# Patient Record
Sex: Female | Born: 1965 | Hispanic: Refuse to answer | Marital: Single
Health system: Midwestern US, Community
[De-identification: ages and names within clinical notes are randomized; demographics above are authoritative.]

## PROBLEM LIST (undated history)

## (undated) DIAGNOSIS — F329 Major depressive disorder, single episode, unspecified: Secondary | ICD-10-CM

## (undated) DIAGNOSIS — F32A Depression, unspecified: Secondary | ICD-10-CM

## (undated) HISTORY — DX: Major depressive disorder, single episode, unspecified: F32.9

## (undated) HISTORY — DX: Depression, unspecified: F32.A

---

## 2001-11-10 ENCOUNTER — Emergency Department (HOSPITAL_COMMUNITY): Admission: EM | Admit: 2001-11-10 | Discharge: 2001-11-10 | Payer: Self-pay | Admitting: Emergency Medicine

## 2007-09-05 ENCOUNTER — Emergency Department (HOSPITAL_COMMUNITY): Admission: EM | Admit: 2007-09-05 | Discharge: 2007-09-05 | Payer: Self-pay | Admitting: Emergency Medicine

## 2007-09-24 ENCOUNTER — Encounter: Admission: RE | Admit: 2007-09-24 | Discharge: 2007-09-24 | Payer: Self-pay | Admitting: Nurse Practitioner

## 2010-01-29 ENCOUNTER — Emergency Department (HOSPITAL_COMMUNITY): Admission: EM | Admit: 2010-01-29 | Discharge: 2010-01-29 | Payer: Self-pay | Admitting: Emergency Medicine

## 2010-02-06 ENCOUNTER — Emergency Department (HOSPITAL_COMMUNITY): Admission: EM | Admit: 2010-02-06 | Discharge: 2010-02-06 | Payer: Self-pay | Admitting: Emergency Medicine

## 2010-07-13 LAB — URINALYSIS, ROUTINE W REFLEX MICROSCOPIC
Glucose, UA: NEGATIVE mg/dL
Ketones, ur: NEGATIVE mg/dL
Nitrite: NEGATIVE
Specific Gravity, Urine: 1.025 (ref 1.005–1.030)
pH: 6 (ref 5.0–8.0)

## 2010-07-13 LAB — GC/CHLAMYDIA PROBE AMP, GENITAL
Chlamydia, DNA Probe: NEGATIVE
GC Probe Amp, Genital: NEGATIVE

## 2010-07-13 LAB — WET PREP, GENITAL: Yeast Wet Prep HPF POC: NONE SEEN

## 2010-07-13 LAB — GLUCOSE, CAPILLARY: Glucose-Capillary: 80 mg/dL (ref 70–99)

## 2010-07-13 LAB — RPR: RPR Ser Ql: NONREACTIVE

## 2010-12-22 ENCOUNTER — Encounter: Payer: Self-pay | Admitting: *Deleted

## 2010-12-22 ENCOUNTER — Emergency Department (HOSPITAL_COMMUNITY)
Admission: EM | Admit: 2010-12-22 | Discharge: 2010-12-22 | Disposition: A | Discharge status: Home / Self Care | Payer: Self-pay | Attending: Emergency Medicine | Admitting: Emergency Medicine

## 2010-12-22 DIAGNOSIS — A599 Trichomoniasis, unspecified: Secondary | ICD-10-CM | POA: Insufficient documentation

## 2010-12-22 DIAGNOSIS — F17200 Nicotine dependence, unspecified, uncomplicated: Secondary | ICD-10-CM | POA: Insufficient documentation

## 2010-12-22 LAB — URINALYSIS, ROUTINE W REFLEX MICROSCOPIC
Bilirubin Urine: NEGATIVE
Glucose, UA: NEGATIVE mg/dL
Ketones, ur: NEGATIVE mg/dL
Nitrite: NEGATIVE
Specific Gravity, Urine: 1.03 (ref 1.005–1.030)
Urobilinogen, UA: 0.2 mg/dL (ref 0.0–1.0)
pH: 6 (ref 5.0–8.0)

## 2010-12-22 LAB — WET PREP, GENITAL
Clue Cells Wet Prep HPF POC: NONE SEEN
Yeast Wet Prep HPF POC: NONE SEEN

## 2010-12-22 LAB — URINE MICROSCOPIC-ADD ON

## 2010-12-22 LAB — POCT PREGNANCY, URINE: Preg Test, Ur: NEGATIVE

## 2010-12-22 MED ORDER — METRONIDAZOLE 500 MG PO TABS
2000.0000 mg | ORAL_TABLET | Freq: Once | ORAL | Status: AC
  Administered 2010-12-22: 2000 mg via ORAL
  Filled 2010-12-22 (×2): qty 4

## 2010-12-22 NOTE — ED Notes (Signed)
Resting quietly in bed. Denies pain or discomfort. NAD noted.

## 2010-12-22 NOTE — ED Provider Notes (Signed)
History     CSN: [id number] Arrival date & time: 12/22/2010 10:04 AM  Chief Complaint  Patient presents with  . Vaginal Discharge   HPI Comments: Patient c/o watery, thin vaginal discharge with itching for approx 3 weeks.  States she had unprotected sex with someone at onset of sx's.  She denies fever, vomiting, abd pain or urinary sx's.  Patient is a 45 y.o. female presenting with vaginal discharge. The history is provided by the patient.  Vaginal Discharge This is a new problem. The current episode started 1 to 4 weeks ago. The problem occurs constantly. The problem has been unchanged. Pertinent negatives include no abdominal pain, arthralgias, chills, fever, headaches, myalgias, neck pain, numbness, rash, swollen glands, urinary symptoms, vomiting or weakness. The symptoms are aggravated by nothing. She has tried nothing for the symptoms. The treatment provided no relief.    History reviewed. No pertinent past medical history.  Past Surgical History  Procedure Date  . Cesarean section     History reviewed. No pertinent family history.  History  Substance Use Topics  . Smoking status: Current Everyday Smoker -- 0.5 packs/day  . Smokeless tobacco: Not on file  . Alcohol Use: No    OB History    Grav Para Term Preterm Abortions TAB SAB Ect Mult Living                  Review of Systems  Constitutional: Negative for fever and chills.  HENT: Negative for neck pain.   Gastrointestinal: Negative for vomiting and abdominal pain.  Genitourinary: Positive for vaginal discharge. Negative for dysuria, urgency, vaginal bleeding, vaginal pain and menstrual problem.  Musculoskeletal: Negative for myalgias and arthralgias.  Skin: Negative for rash.  Neurological: Negative for weakness, numbness and headaches.  Hematological: Negative for adenopathy.  All other systems reviewed and are negative.    Physical Exam  BP 114/65  Pulse 79  Temp(Src) 97.8 F (36.6 C) (Oral)  Resp 16   Ht 5\' 4"  (1.626 m)  Wt 192 lb (87.091 kg)  BMI 32.96 kg/m2  SpO2 100%  Physical Exam  Nursing note and vitals reviewed. Constitutional: She is oriented to person, place, and time. She appears well-developed and well-nourished. No distress.  HENT:  Head: Normocephalic and atraumatic.  Mouth/Throat: Oropharynx is clear and moist.  Neck: Normal range of motion. Neck supple. No thyromegaly present.  Cardiovascular: Normal rate, regular rhythm and normal heart sounds.   Pulmonary/Chest: Effort normal and breath sounds normal.  Abdominal: Soft. She exhibits no distension and no mass. There is no tenderness. There is no rebound and no guarding.  Genitourinary: Uterus normal. There is no rash on the right labia. There is no rash on the left labia. Cervix exhibits discharge. Cervix exhibits no motion tenderness and no friability. Right adnexum displays no mass and no tenderness. Left adnexum displays no mass and no tenderness. No tenderness or bleeding around the vagina. No foreign body around the vagina. Vaginal discharge found.  Musculoskeletal: She exhibits no tenderness.  Lymphadenopathy:    She has no cervical adenopathy.  Neurological: She is alert and oriented to person, place, and time.  Skin: Skin is warm and dry.    ED Course  Procedures  MDM   Patient resting, NAD.  Non-toxic appearing.  I have reviewed nursing notes, vitals and lab results. Abd remains soft, NT.   I have treated pt for trichomonas today with flagyl.  Urine, GC, and chlaymdia cultures are pending.  She agrees to return  to ER if needed and to advise all sexual partners to be tested.    Patient / Family / Caregiver understand and agree with initial ED impression and plan with expectations set for ED visit.   Filed Vitals:   12/22/10 0958  BP: 114/65  Pulse: 79  Temp: 97.8 F (36.6 C)  Resp: 16       12/22/2010   Medications  Lactobacillus (ACIDOPHILUS) 10 MG CAPS (not administered)  Homeopathic  Products (AZO CONFIDENCE PO) (not administered)  metroNIDAZOLE (FLAGYL) tablet 2,000 mg (2000 mg Oral Given 12/22/10 1345)     The patient appears reasonably screened and/or stabilized for discharge and I doubt any other medical condition or other [facility] requiring further screening, evaluation, or treatment in the ED at this time prior to discharge.   Danna Sewell L. [place], [place] 12/28/10 [phone]

## 2010-12-22 NOTE — ED Notes (Signed)
Pt c/o clear watery discharge and vaginal itching x 3 weeks.

## 2010-12-23 LAB — GC/CHLAMYDIA PROBE AMP, GENITAL
Chlamydia, DNA Probe: NEGATIVE
GC Probe Amp, Genital: NEGATIVE

## 2011-01-03 NOTE — ED Provider Notes (Signed)
Medical screening examination/treatment/procedure(s) were conducted as a shared visit with non-physician practitioner(s) and myself.  I personally evaluated the patient during the encounter  Toy Baker, MD 01/03/11 2334

## 2011-03-16 ENCOUNTER — Emergency Department (HOSPITAL_COMMUNITY)
Admission: EM | Admit: 2011-03-16 | Discharge: 2011-03-16 | Disposition: A | Discharge status: Home / Self Care | Payer: Self-pay | Attending: Emergency Medicine | Admitting: Emergency Medicine

## 2011-03-16 ENCOUNTER — Encounter (HOSPITAL_COMMUNITY): Payer: Self-pay

## 2011-03-16 DIAGNOSIS — A599 Trichomoniasis, unspecified: Secondary | ICD-10-CM | POA: Insufficient documentation

## 2011-03-16 LAB — WET PREP, GENITAL
Clue Cells Wet Prep HPF POC: NONE SEEN
Yeast Wet Prep HPF POC: NONE SEEN

## 2011-03-16 MED ORDER — METRONIDAZOLE 500 MG PO TABS: 500.0000 mg | ORAL_TABLET | Freq: Two times a day (BID) | ORAL | Status: AC

## 2011-03-16 NOTE — ED Notes (Signed)
Pt presents with vaginal discharge. Pt states she thinks it is trichomonas. Pt was diagnosed with the same on 12/22/2010

## 2011-03-16 NOTE — ED Notes (Signed)
EDP in room with pt 

## 2011-03-16 NOTE — ED Provider Notes (Cosign Needed)
Scribed for Ward Givens, MD, the patient was seen in room APFT24/APFT24 . This chart was scribed by Ellie Lunch.   CSN: [id number] Arrival date & time: 03/16/2011  2:06 PM   First MD Initiated Contact with Patient 03/16/11 1458      Chief Complaint  Patient presents with  . Vaginal Discharge    (Consider location/radiation/quality/duration/timing/severity/associated sxs/prior treatment) HPI Pt seen at 3:14 PM  Rebecca Barron is a 45 y.o. female who presents to the Emergency Department complaining of vaginal discharge starting 2 days ago. Vaginal discharge is clear, thin, and odorous and associated with mild suprapubic pain. Pt denies  n/v, dysuria, difficulty urinating. Pt has h/o of trichomonas d/x 12/22/2010. Pt reports she had unprotected intercourse 4 days ago with the same partner who may still have trichomonas.   Pt does not have a current PCP.   History reviewed. No pertinent past medical history.  Postmenopausal last normal period 3 years ago  Past Surgical History  Procedure Date  . Cesarean section x 2      No family history on file.  History  Substance Use Topics  . Smoking status: Current Everyday Smoker -- 0.5 packs/day  . Smokeless tobacco: Not on file  . Alcohol Use: No  Not currently working.   Review of Systems  Gastrointestinal: Positive for abdominal pain (mild suprapubic pain).  Genitourinary: Positive for vaginal discharge. Negative for dysuria and difficulty urinating.  All other systems reviewed and are negative.    Allergies  Review of patient's allergies indicates no known allergies.  Home Medications   Current Outpatient Rx  Name Route Sig Dispense Refill  . ACIDOPHILUS 10 MG PO CAPS Oral Take 10 mg by mouth daily.      Marland Kitchen PHENTERMINE HCL 37.5 MG PO TABS Oral Take 37.5 mg by mouth daily before breakfast.      Phentermine started 5 days ago and Rx'd by Dr. Quintella Reichert, a bariatrics specialist.   BP 117/72  Pulse 87  Temp(Src) 97.6 F  (36.4 C) (Oral)  Resp 18  Ht 5\' 5"  (1.651 m)  Wt 185 lb (83.915 kg)  BMI 30.79 kg/m2  SpO2 99%   Vital signs normal  Physical Exam  Vitals reviewed. Constitutional: She appears well-developed and well-nourished.  HENT:  Head: Normocephalic and atraumatic.  Eyes: Conjunctivae and EOM are normal. Pupils are equal, round, and reactive to light.  Neck: Neck supple.  Pulmonary/Chest: Effort normal. Respiratory distress:  there is no guarding or rebound. Patient's noted to be poking her finger in the area stating "this is where it hurts"  Abdominal: Soft. Bowel sounds are normal. She exhibits no distension. There is tenderness. There is no rebound and no guarding.  Genitourinary:       Patient has normal external genitalia. She has thin white discharge noted. She has mild erythema of the cervix. Her uterus is nontender to palpation and she has no pain to movement of her cervix. Her left adnexa is nontender she has minimal discomfort of her right ovary which does not feel enlarged.  Musculoskeletal: Normal range of motion.       Pt has no acute abnormality.    Skin: Skin is warm and dry.  Psychiatric: She has a normal mood and affect. Her behavior is normal. Thought content normal.    ED Course  Procedures (including critical care time)No results found.     OTHER DATA REVIEWED: Nursing notes, vital signs, and past medical records reviewed.   DIAGNOSTIC STUDIES: Oxygen  Saturation is 99% on room air, normal by my interpretation.     Results for orders placed during the hospital encounter of 03/16/11  WET PREP, GENITAL      Component Value Range   Yeast, Wet Prep NONE SEEN  NONE SEEN    Trich, Wet Prep FEW (*) NONE SEEN    Clue Cells, Wet Prep NONE SEEN  NONE SEEN    WBC, Wet Prep HPF POC MANY (*) NONE SEEN    Laboratory interpretation trichomoniasis   ED DISCHARGE MEDICATIONS New Prescriptions   METRONIDAZOLE (FLAGYL) 500 MG TABLET    Take 1 tablet (500 mg total) by  mouth 2 (two) times daily.     1. Trichomonas       MDM    I personally performed the services described in this documentation, which was scribed in my presence. The recorded information has been reviewed and considered.        Ward Givens, MD 03/17/11 0130

## 2011-03-17 LAB — GC/CHLAMYDIA PROBE AMP, GENITAL
Chlamydia, DNA Probe: NEGATIVE
GC Probe Amp, Genital: NEGATIVE

## 2011-07-11 ENCOUNTER — Encounter (HOSPITAL_COMMUNITY): Payer: Self-pay | Admitting: *Deleted

## 2011-07-11 ENCOUNTER — Emergency Department (HOSPITAL_COMMUNITY)
Admission: EM | Admit: 2011-07-11 | Discharge: 2011-07-11 | Disposition: A | Discharge status: Home / Self Care | Payer: Self-pay | Attending: Emergency Medicine | Admitting: Emergency Medicine

## 2011-07-11 DIAGNOSIS — B9689 Other specified bacterial agents as the cause of diseases classified elsewhere: Secondary | ICD-10-CM | POA: Insufficient documentation

## 2011-07-11 DIAGNOSIS — A499 Bacterial infection, unspecified: Secondary | ICD-10-CM | POA: Insufficient documentation

## 2011-07-11 DIAGNOSIS — L293 Anogenital pruritus, unspecified: Secondary | ICD-10-CM | POA: Insufficient documentation

## 2011-07-11 DIAGNOSIS — N760 Acute vaginitis: Secondary | ICD-10-CM | POA: Insufficient documentation

## 2011-07-11 LAB — URINALYSIS, ROUTINE W REFLEX MICROSCOPIC
Bilirubin Urine: NEGATIVE
Ketones, ur: NEGATIVE mg/dL
Leukocytes, UA: NEGATIVE
Nitrite: NEGATIVE
Specific Gravity, Urine: 1.005 (ref 1.005–1.030)
Urobilinogen, UA: 0.2 mg/dL (ref 0.0–1.0)

## 2011-07-11 LAB — WET PREP, GENITAL

## 2011-07-11 LAB — RPR: RPR Ser Ql: NONREACTIVE

## 2011-07-11 LAB — POCT PREGNANCY, URINE: Preg Test, Ur: NEGATIVE

## 2011-07-11 MED ORDER — METRONIDAZOLE 500 MG PO TABS: 500.0000 mg | ORAL_TABLET | Freq: Two times a day (BID) | ORAL | Status: AC

## 2011-07-11 NOTE — Discharge Instructions (Signed)
Bacterial Vaginosis Bacterial vaginosis (BV) is a vaginal infection where the normal balance of bacteria in the vagina is disrupted. The normal balance is then replaced by an overgrowth of certain bacteria. There are several different kinds of bacteria that can cause BV. BV is the most common vaginal infection in women of childbearing age. CAUSES   The cause of BV is not fully understood. BV develops when there is an increase or imbalance of harmful bacteria.   Some activities or behaviors can upset the normal balance of bacteria in the vagina and put women at increased risk including:   Having a new sex partner or multiple sex partners.   Douching.   Using an intrauterine device (IUD) for contraception.   It is not clear what role sexual activity plays in the development of BV. However, women that have never had sexual intercourse are rarely infected with BV.  Women do not get BV from toilet seats, bedding, swimming pools or from touching objects around them.  SYMPTOMS   Grey vaginal discharge.   A fish-like odor with discharge, especially after sexual intercourse.   Itching or burning of the vagina and vulva.   Burning or pain with urination.   Some women have no signs or symptoms at all.  DIAGNOSIS  Your caregiver must examine the vagina for signs of BV. Your caregiver will perform lab tests and look at the sample of vaginal fluid through a microscope. They will look for bacteria and abnormal cells (clue cells), a pH test higher than 4.5, and a positive amine test all associated with BV.  RISKS AND COMPLICATIONS   Pelvic inflammatory disease (PID).   Infections following gynecology surgery.   Developing HIV.   Developing herpes virus.  TREATMENT  Sometimes BV will clear up without treatment. However, all women with symptoms of BV should be treated to avoid complications, especially if gynecology surgery is planned. Female partners generally do not need to be treated. However,  BV may spread between female sex partners so treatment is helpful in preventing a recurrence of BV.   BV may be treated with antibiotics. The antibiotics come in either pill or vaginal cream forms. Either can be used with nonpregnant or pregnant women, but the recommended dosages differ. These antibiotics are not harmful to the baby.   BV can recur after treatment. If this happens, a second round of antibiotics will often be prescribed.   Treatment is important for pregnant women. If not treated, BV can cause a premature delivery, especially for a pregnant woman who had a premature birth in the past. All pregnant women who have symptoms of BV should be checked and treated.   For chronic reoccurrence of BV, treatment with a type of prescribed gel vaginally twice a week is helpful.  HOME CARE INSTRUCTIONS   Finish all medication as directed by your caregiver.   Do not have sex until treatment is completed.   Tell your sexual partner that you have a vaginal infection. They should see their caregiver and be treated if they have problems, such as a mild rash or itching.   Practice safe sex. Use condoms. Only have 1 sex partner.  PREVENTION  Basic prevention steps can help reduce the risk of upsetting the natural balance of bacteria in the vagina and developing BV:  Do not have sexual intercourse (be abstinent).   Do not douche.   Use all of the medicine prescribed for treatment of BV, even if the signs and symptoms go away.     Tell your sex partner if you have BV. That way, they can be treated, if needed, to prevent reoccurrence.  SEEK MEDICAL CARE IF:   Your symptoms are not improving after 3 days of treatment.   You have increased discharge, pain, or fever.  MAKE SURE YOU:   Understand these instructions.   Will watch your condition.   Will get help right away if you are not doing well or get worse.  FOR MORE INFORMATION  Division of STD Prevention (DSTDP), Centers for Disease  Control and Prevention: www.cdc.gov/std American Social Health Association (ASHA): www.ashastd.org  Document Released: 04/16/2005 Document Revised: 04/05/2011 Document Reviewed: 10/07/2008 Exit[employer] Patient Information 2012 [employer]. 

## 2011-07-11 NOTE — ED Notes (Signed)
Pt wants to be checked for trichomonas. Pt states that she has had vaginal itching since having sex on Friday night. States that she has had it twice in the last few months.

## 2011-07-11 NOTE — ED Notes (Signed)
Pelvic cart set up and pelvic bed moved into room.

## 2011-07-11 NOTE — ED Provider Notes (Signed)
History     CSN: [id number]  Arrival date & time 07/11/11  2956   First MD Initiated Contact with Patient 07/11/11 [phone]      Chief Complaint  Patient presents with  . Vaginal Itching    (Consider location/radiation/quality/duration/timing/severity/associated sxs/prior treatment) HPI Comments: Patient has had several episodes of Trichomonas since last summer, and had intercourse with her occasional partner one week ago who told her he had also been treated for Trichomonas with her last episode of this diagnosis.  She since developed vaginal itching without discharge and is concerned her Trichomonas may have returned.  She reports unprotected intercourse.  She's also concerned about possibility of other STDs including HIV.  She does not have a primary care doctor.   Patient is a 46 y.o. female presenting with vaginal itching. The history is provided by the patient.  Vaginal Itching This is a recurrent problem. The current episode started in the past 7 days. The problem occurs constantly. The problem has been unchanged. Pertinent negatives include no abdominal pain, arthralgias, chest pain, chills, congestion, fever, headaches, joint swelling, nausea, neck pain, numbness, rash, sore throat, vomiting or weakness. The symptoms are aggravated by nothing. She has tried nothing for the symptoms. The treatment provided no relief.    History reviewed. No pertinent past medical history.  Past Surgical History  Procedure Date  . Cesarean section     History reviewed. No pertinent family history.  History  Substance Use Topics  . Smoking status: Current Everyday Smoker -- 0.5 packs/day  . Smokeless tobacco: Not on file  . Alcohol Use: No    OB History    Grav Para Term Preterm Abortions TAB SAB Ect Mult Living                  Review of Systems  Constitutional: Negative for fever and chills.  HENT: Negative for congestion, sore throat and neck pain.   Eyes: Negative.   Respiratory:  Negative for chest tightness and shortness of breath.   Cardiovascular: Negative for chest pain.  Gastrointestinal: Negative for nausea, vomiting and abdominal pain.  Genitourinary: Negative.  Negative for dysuria, urgency, hematuria, flank pain and vaginal discharge.  Musculoskeletal: Negative for joint swelling and arthralgias.  Skin: Negative.  Negative for rash and wound.  Neurological: Negative for dizziness, weakness, light-headedness, numbness and headaches.  Hematological: Negative.   Psychiatric/Behavioral: Negative.     Allergies  Review of patient's allergies indicates no known allergies.  Home Medications   Current Outpatient Rx  Name Route Sig Dispense Refill  . OMEGA-3 FATTY ACIDS 1000 MG PO CAPS Oral Take 4 g by mouth daily.    Marland Kitchen FOLIC ACID PO Oral Take 1 tablet by mouth daily.    . IBUPROFEN 200 MG PO TABS Oral Take 200 mg by mouth every 6 (six) hours as needed. Pain    . ACIDOPHILUS 10 MG PO CAPS Oral Take 10 mg by mouth daily.      Marland Kitchen METRONIDAZOLE 500 MG PO TABS Oral Take 1 tablet (500 mg total) by mouth 2 (two) times daily. 14 tablet 0    BP 104/47  Pulse 92  Temp(Src) 97.5 F (36.4 C) (Oral)  Resp 21  Ht 5\' 5"  (1.651 m)  Wt 185 lb (83.915 kg)  BMI 30.79 kg/m2  SpO2 96%  Physical Exam  Nursing note and vitals reviewed. Constitutional: She is oriented to person, place, and time. She appears well-developed and well-nourished.  HENT:  Head: Normocephalic and atraumatic.  Eyes: Conjunctivae are normal.  Neck: Normal range of motion.  Cardiovascular: Normal rate, regular rhythm, normal heart sounds and intact distal pulses.   Pulmonary/Chest: Effort normal and breath sounds normal. She has no wheezes.  Abdominal: Soft. Bowel sounds are normal. There is no tenderness.  Genitourinary: Uterus normal. Uterus is not tender. Cervix exhibits no motion tenderness, no discharge and no friability. Right adnexum displays no mass, no tenderness and no fullness. Left  adnexum displays no mass, no tenderness and no fullness. No erythema around the vagina. Vaginal discharge found.  Musculoskeletal: Normal range of motion.  Neurological: She is alert and oriented to person, place, and time.  Skin: Skin is warm and dry.  Psychiatric: She has a normal mood and affect.    ED Course  Procedures (including critical care time)  Labs Reviewed  WET PREP, GENITAL - Abnormal; Notable for the following:    Clue Cells Wet Prep HPF POC MANY (*)    WBC, Wet Prep HPF POC MANY (*)    All other components within normal limits  URINALYSIS, ROUTINE W REFLEX MICROSCOPIC  POCT PREGNANCY, URINE  GC/CHLAMYDIA PROBE AMP, GENITAL  RPR  HIV ANTIBODY (ROUTINE TESTING)   No results found.   1. Bacterial vaginosis     Other cultures including gonorrhea, Chlamydia, RPR pending.  HIV antibody also drawn, also pending at time of discharge.  MDM  Flagyl twice a day for 7 days.  Patient advised to have her partner treated for BV prior to sexual activity.        Candis Musa, PA 07/11/11 1141

## 2011-07-11 NOTE — ED Provider Notes (Signed)
Medical screening examination/treatment/procedure(s) were performed by non-physician practitioner and as supervising physician I was immediately available for consultation/collaboration. Chia Rock Y.   Gavin Pound. Lanayah Gartley, MD 07/11/11 1350

## 2011-07-12 LAB — GC/CHLAMYDIA PROBE AMP, GENITAL: Chlamydia, DNA Probe: NEGATIVE

## 2011-08-06 ENCOUNTER — Emergency Department (HOSPITAL_COMMUNITY)
Admission: EM | Admit: 2011-08-06 | Discharge: 2011-08-06 | Disposition: A | Discharge status: Home / Self Care | Payer: Self-pay | Attending: Emergency Medicine | Admitting: Emergency Medicine

## 2011-08-06 ENCOUNTER — Encounter (HOSPITAL_COMMUNITY): Payer: Self-pay | Admitting: *Deleted

## 2011-08-06 DIAGNOSIS — A599 Trichomoniasis, unspecified: Secondary | ICD-10-CM | POA: Insufficient documentation

## 2011-08-06 DIAGNOSIS — Z79899 Other long term (current) drug therapy: Secondary | ICD-10-CM | POA: Insufficient documentation

## 2011-08-06 DIAGNOSIS — L293 Anogenital pruritus, unspecified: Secondary | ICD-10-CM | POA: Insufficient documentation

## 2011-08-06 DIAGNOSIS — F17200 Nicotine dependence, unspecified, uncomplicated: Secondary | ICD-10-CM | POA: Insufficient documentation

## 2011-08-06 LAB — WET PREP, GENITAL: Yeast Wet Prep HPF POC: NONE SEEN

## 2011-08-06 MED ORDER — METRONIDAZOLE 500 MG PO TABS
2000.0000 mg | ORAL_TABLET | Freq: Once | ORAL | Status: AC
  Administered 2011-08-06: 2000 mg via ORAL
  Filled 2011-08-06: qty 4

## 2011-08-06 NOTE — ED Provider Notes (Signed)
History     CSN: [id number]  Arrival date & time 08/06/11  1101   First MD Initiated Contact with Patient 08/06/11 1146      Chief Complaint  Patient presents with  . Vaginal Itching    (Consider location/radiation/quality/duration/timing/severity/associated sxs/prior treatment) HPI Comments: Patient c/o vaginal discharge and itching for several days.  States she has been seen here and treated for trichomonas vaginitis.  States sx's today feel similar to previous.  States she is sexually active with same partner as previous.  Denies vaginal bleeding, abd pain or fever.   Patient is a 46 y.o. female presenting with vaginal discharge. The history is provided by the patient.  Vaginal Discharge This is a new problem. The current episode started in the past 7 days. The problem occurs constantly. The problem has been unchanged. Associated symptoms include urinary symptoms. Pertinent negatives include no abdominal pain, coughing, fever, nausea, rash, sore throat, vomiting or weakness. The symptoms are aggravated by nothing. She has tried nothing for the symptoms. The treatment provided no relief.    History reviewed. No pertinent past medical history.  Past Surgical History  Procedure Date  . Cesarean section     History reviewed. No pertinent family history.  History  Substance Use Topics  . Smoking status: Current Everyday Smoker -- 0.5 packs/day  . Smokeless tobacco: Not on file  . Alcohol Use: No    OB History    Grav Para Term Preterm Abortions TAB SAB Ect Mult Living                  Review of Systems  Constitutional: Negative for fever.  HENT: Negative for sore throat.   Respiratory: Negative for cough.   Gastrointestinal: Negative for nausea, vomiting and abdominal pain.  Genitourinary: Positive for dysuria and vaginal discharge. Negative for hematuria, vaginal bleeding, difficulty urinating, genital sores and pelvic pain.  Musculoskeletal: Negative.   Skin:  Negative for rash.  Neurological: Negative for weakness.  All other systems reviewed and are negative.    Allergies  Review of patient's allergies indicates no known allergies.  Home Medications   Current Outpatient Rx  Name Route Sig Dispense Refill  . OMEGA-3 FATTY ACIDS 1000 MG PO CAPS Oral Take 4 g by mouth daily.    Marland Kitchen FOLIC ACID PO Oral Take 1 tablet by mouth daily.    . IBUPROFEN 200 MG PO TABS Oral Take 200 mg by mouth every 6 (six) hours as needed. Pain    . ACIDOPHILUS 10 MG PO CAPS Oral Take 10 mg by mouth daily.        BP 115/73  Pulse 84  Temp(Src) 98 F (36.7 C) (Oral)  Resp 20  Ht 5\' 4"  (1.626 m)  Wt 180 lb (81.647 kg)  BMI 30.90 kg/m2  SpO2 99%  Physical Exam  Nursing note and vitals reviewed. Constitutional: She is oriented to person, place, and time. She appears well-developed and well-nourished. No distress.  HENT:  Head: Normocephalic and atraumatic.  Cardiovascular: Normal rate, regular rhythm and normal heart sounds.   Pulmonary/Chest: Effort normal and breath sounds normal. No respiratory distress.  Abdominal: Soft. She exhibits no distension. There is no tenderness. There is no rebound and no guarding.  Genitourinary: Uterus normal. Cervix exhibits no motion tenderness and no friability. Right adnexum displays no mass and no tenderness. Left adnexum displays no mass and no tenderness. No bleeding around the vagina. No foreign body around the vagina. Vaginal discharge found.  White-green colored vaginal discharge. Cervix is closed, NT.  No adnexal masses  Musculoskeletal: Normal range of motion.  Neurological: She is alert and oriented to person, place, and time. She exhibits normal muscle tone. Coordination normal.  Skin: Skin is warm and dry.    ED Course  Procedures (including critical care time)  Labs Reviewed  WET PREP, GENITAL - Abnormal; Notable for the following:    Trich, Wet Prep MANY (*)    Clue Cells Wet Prep HPF POC MANY (*)      WBC, Wet Prep HPF POC MANY (*)    All other components within normal limits  GC/CHLAMYDIA PROBE AMP, GENITAL     GC and Chlamydia cultures are pending   MDM    I have reviewed previous ED chart, laboratory studies and nursing notes.   Patient is alert. Nontoxic appearing. She ambulates with a steady gait. Is soft and nontender on exam. Patient has multiple visits here for similar symptoms and has been treated several times for trichomonas.  I have advised her of safe sex practices. I will treat her today with a 2 gram dose of Flagyl. She agrees to followup with her health department or to return here for worsening symptoms.     Tarrance Januszewski L. [place], [place] 08/08/11 1713

## 2011-08-06 NOTE — ED Notes (Signed)
Vaginal itching, with d/c green.  Has been seen for same.  Says she needs refill of flagyl.

## 2011-08-06 NOTE — Discharge Instructions (Signed)
Trichomoniasis  Trichomoniasis is an infection, caused by the Trichomonas organism, that affects both women and men. In women, the outer female genitalia and the vagina are affected. In men, the penis is mainly affected, but the prostate and other reproductive organs can also be involved. Trichomoniasis is a sexually transmitted disease (STD) and is most often passed to another person through sexual contact. The majority of people who get trichomoniasis do so from a sexual encounter and are also at risk for other STDs.  CAUSES    Sexual intercourse with an infected partner.   It can be present in swimming pools or hot tubs.  SYMPTOMS    Abnormal gray-green frothy vaginal discharge in women.   Vaginal itching and irritation in women.   Itching and irritation of the area outside the vagina in women.   Penile discharge with or without pain in males.   Inflammation of the urethra (urethritis), causing painful urination.   Bleeding after sexual intercourse.  RELATED COMPLICATIONS   Pelvic inflammatory disease.   Infection of the uterus (endometritis).   Infertility.   Tubal (ectopic) pregnancy.   It can be associated with other STDs, including gonorrhea and chlamydia, hepatitis B, and HIV.  COMPLICATIONS DURING PREGNANCY   Early (premature) delivery.   Premature rupture of the membranes (PROM).   Low birth weight.  DIAGNOSIS    Visualization of Trichomonas under the microscope from the vagina discharge.   Ph of the vagina greater than 4.5, tested with a test tape.   Trich Rapid Test.   Culture of the organism, but this is not usually needed.   It may be found on a Pap test.   Having a "strawberry cervix,"which means the cervix looks very red like a strawberry.  TREATMENT    You may be given medication to fight the infection. Inform your caregiver if you could be or are pregnant. Some medications used to treat the infection should not be taken during pregnancy.   Over-the-counter medications or  creams to decrease itching or irritation may be recommended.   Your sexual partner will need to be treated if infected.  HOME CARE INSTRUCTIONS    Take all medication prescribed by your caregiver.   Take over-the-counter medication for itching or irritation as directed by your caregiver.   Do not have sexual intercourse while you have the infection.   Do not douche or wear tampons.   Discuss your infection with your partner, as your partner may have acquired the infection from you. Or, your partner may have been the person who transmitted the infection to you.   Have your sex partner examined and treated if necessary.   Practice safe, informed, and protected sex.   See your caregiver for other STD testing.  SEEK MEDICAL CARE IF:    You still have symptoms after you finish the medication.   You have an oral temperature above 102 F (38.9 C).   You develop belly (abdominal) pain.   You have pain when you urinate.   You have bleeding after sexual intercourse.   You develop a rash.   The medication makes you sick or makes you throw up (vomit).  Document Released: 10/10/2000 Document Revised: 04/05/2011 Document Reviewed: 11/05/2008  Exit[employer] Patient Information 2012 [employer].

## 2011-08-07 LAB — GC/CHLAMYDIA PROBE AMP, GENITAL
Chlamydia, DNA Probe: NEGATIVE
GC Probe Amp, Genital: NEGATIVE

## 2011-08-10 ENCOUNTER — Emergency Department (HOSPITAL_COMMUNITY)
Admission: EM | Admit: 2011-08-10 | Discharge: 2011-08-10 | Disposition: A | Discharge status: Home / Self Care | Payer: Self-pay | Attending: Emergency Medicine | Admitting: Emergency Medicine

## 2011-08-10 ENCOUNTER — Encounter (HOSPITAL_COMMUNITY): Payer: Self-pay | Admitting: Emergency Medicine

## 2011-08-10 ENCOUNTER — Emergency Department (HOSPITAL_COMMUNITY): Payer: Self-pay

## 2011-08-10 DIAGNOSIS — R071 Chest pain on breathing: Secondary | ICD-10-CM | POA: Insufficient documentation

## 2011-08-10 DIAGNOSIS — T148XXA Other injury of unspecified body region, initial encounter: Secondary | ICD-10-CM | POA: Insufficient documentation

## 2011-08-10 DIAGNOSIS — X58XXXA Exposure to other specified factors, initial encounter: Secondary | ICD-10-CM | POA: Insufficient documentation

## 2011-08-10 MED ORDER — OXYCODONE-ACETAMINOPHEN 5-325 MG PO TABS
1.0000 | ORAL_TABLET | Freq: Once | ORAL | Status: AC
  Administered 2011-08-10: 1 via ORAL
  Filled 2011-08-10: qty 1

## 2011-08-10 MED ORDER — OXYCODONE-ACETAMINOPHEN 5-325 MG PO TABS: 1.0000 | ORAL_TABLET | ORAL | Status: AC | PRN

## 2011-08-10 NOTE — ED Provider Notes (Signed)
Medical screening examination/treatment/procedure(s) were performed by non-physician practitioner and as supervising physician I was immediately available for consultation/collaboration.   Laray Anger, DO 08/10/11 2001

## 2011-08-10 NOTE — ED Notes (Signed)
Pt c/o back pain after her left shoulder blade popped when she rolled over this am.

## 2011-08-10 NOTE — ED Provider Notes (Signed)
Medical screening examination/treatment/procedure(s) were performed by non-physician practitioner and as supervising physician I was immediately available for consultation/collaboration.   Annalysia Willenbring L Leniya Breit, MD 08/10/11 1813 

## 2011-08-10 NOTE — ED Notes (Signed)
Pt states attempted to complete a physical fitness examine in the job application process on Wednesday, and today when getting out of bed she "felt something pop in left side and shoulder". Pt now c/o pain in left side. Denies SOB.

## 2011-08-10 NOTE — Discharge Instructions (Signed)
Muscle Strain A muscle strain (pulled muscle) happens when a muscle is over-stretched. Recovery usually takes 5 to 6 weeks.  HOME CARE   Put ice on the injured area.   Put ice in a plastic bag.   Place a towel between your skin and the bag.   Leave the ice on for 15 to 20 minutes at a time, every hour for the first 2 days.   Do not use the muscle for several days or until your doctor says you can. Do not use the muscle if you have pain.   Wrap the injured area with an elastic bandage for comfort. Do not put it on too tightly.   Only take medicine as told by your doctor.   Warm up before exercise. This helps prevent muscle strains.  GET HELP RIGHT AWAY IF:  There is increased pain or puffiness (swelling) in the affected area. MAKE SURE YOU:   Understand these instructions.   Will watch your condition.   Will get help right away if you are not doing well or get worse.  Document Released: 01/24/2008 Document Revised: 04/05/2011 Document Reviewed: 01/24/2008 Exit[employer] Patient Information 2012 [employer]. 

## 2011-08-10 NOTE — ED Provider Notes (Signed)
History     CSN: [id number]  Arrival date & time 08/10/11  1021   None     Chief Complaint  Patient presents with  . Back Pain     HPI Rebecca Barron is a 46 y.o. female who presents to the emergency department for back pain. 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 Yesterday back was sore but early this morning stretched and felt a pop in left side of back and felt severe pain. Pain radiates to left breast. Rates the pain as 9/10. She describes the pain as sharp and constant. The pain is worse with movement and deep breath.  History reviewed. No pertinent past medical history.  Past Surgical History  Procedure Date  . Cesarean section     History reviewed. No pertinent family history.  History  Substance Use Topics  . Smoking status: Current Everyday Smoker -- 0.5 packs/day  . Smokeless tobacco: Not on file  . Alcohol Use: No    OB History    Grav Para Term Preterm Abortions TAB SAB Ect Mult Living                  Review of Systems  Constitutional: Negative for fever, chills, diaphoresis and fatigue.  HENT: Negative for ear pain, congestion, sore throat, facial swelling, neck pain, neck stiffness, dental problem and sinus pressure.   Eyes: Negative for photophobia, pain and discharge.  Respiratory: Negative for cough, chest tightness and wheezing.        Pain with deep breath  Cardiovascular: Negative.   Gastrointestinal: Negative for nausea, vomiting, abdominal pain, diarrhea, constipation and abdominal distention.  Genitourinary: Negative for dysuria, frequency, flank pain and difficulty urinating.  Musculoskeletal: Positive for back pain. Negative for myalgias and gait problem.       Left rib pain  Skin: Negative for color change and rash.  Neurological: Negative for dizziness, speech difficulty, weakness, light-headedness, numbness and headaches.  Psychiatric/Behavioral: Negative for confusion and agitation.    Allergies  Review  of patient's allergies indicates no known allergies.  Home Medications   Current Outpatient Rx  Name Route Sig Dispense Refill  . OMEGA-3 FATTY ACIDS 1000 MG PO CAPS Oral Take 4 g by mouth daily.    Marland Kitchen FOLIC ACID PO Oral Take 1 tablet by mouth daily.    . IBUPROFEN 200 MG PO TABS Oral Take 200 mg by mouth every 6 (six) hours as needed. Pain    . ACIDOPHILUS 10 MG PO CAPS Oral Take 10 mg by mouth daily.        BP 108/47  Pulse 77  Temp(Src) 98.2 F (36.8 C) (Oral)  Resp 17  Ht 5\' 5"  (1.651 m)  Wt 180 lb (81.647 kg)  BMI 29.95 kg/m2  SpO2 100%  Physical Exam  Nursing note and vitals reviewed. Constitutional: She is oriented to person, place, and time. She appears well-developed and well-nourished. No distress.       Appears uncomfortable.  HENT:  Head: Normocephalic and atraumatic.  Eyes: EOM are normal.  Neck: Normal range of motion. Neck supple.  Cardiovascular: Normal rate and regular rhythm.   Pulmonary/Chest: Effort normal and breath sounds normal.  Abdominal: Soft. Bowel sounds are normal. There is no tenderness.  Musculoskeletal:       Right shoulder: She exhibits tenderness.       Arms: Neurological: She is alert and oriented to person, place, and time. She has normal reflexes. No cranial nerve deficit.  Normal pulses, adequate circulation  Skin: Skin is warm and dry.  Psychiatric: She has a normal mood and affect. Her behavior is normal. Judgment and thought content normal.    ED Course  Procedures Dg Ribs Unilateral W/chest Left  08/10/2011  *RADIOLOGY REPORT*  Clinical Data: Left-sided posterior rib pain  LEFT RIBS AND CHEST - 3+ VIEW  Comparison: None.  Findings: Normal cardiac silhouette and mediastinal contours. Bibasilar opacities favored due to atelectasis.  No definite pleural effusion or pneumothorax.  There are no displaced rib fractures with special attention paid to the area demarcated by the radiopaque BB.  IMPRESSION: No acute cardiopulmonary  disease, specifically, no displaced rib fractures.  Original Report Authenticated By: Waynard Reeds, M.D.   Assessment: Muscle strain  Plan:  ibuprofen   Percocet Rx   Follow up with PCP    MDM          Janne Napoleon, NP 08/10/11 1340

## 2012-06-25 ENCOUNTER — Encounter (HOSPITAL_COMMUNITY): Payer: Self-pay

## 2012-06-25 ENCOUNTER — Emergency Department (HOSPITAL_COMMUNITY)
Admission: EM | Admit: 2012-06-25 | Discharge: 2012-06-25 | Disposition: A | Discharge status: Home / Self Care | Payer: Self-pay | Attending: Emergency Medicine | Admitting: Emergency Medicine

## 2012-06-25 DIAGNOSIS — N898 Other specified noninflammatory disorders of vagina: Secondary | ICD-10-CM | POA: Insufficient documentation

## 2012-06-25 DIAGNOSIS — N760 Acute vaginitis: Secondary | ICD-10-CM | POA: Insufficient documentation

## 2012-06-25 DIAGNOSIS — Z79899 Other long term (current) drug therapy: Secondary | ICD-10-CM | POA: Insufficient documentation

## 2012-06-25 DIAGNOSIS — B9689 Other specified bacterial agents as the cause of diseases classified elsewhere: Secondary | ICD-10-CM

## 2012-06-25 DIAGNOSIS — F17200 Nicotine dependence, unspecified, uncomplicated: Secondary | ICD-10-CM | POA: Insufficient documentation

## 2012-06-25 LAB — WET PREP, GENITAL

## 2012-06-25 LAB — GLUCOSE, CAPILLARY: Glucose-Capillary: 86 mg/dL (ref 70–99)

## 2012-06-25 MED ORDER — METRONIDAZOLE 500 MG PO TABS
500.0000 mg | ORAL_TABLET | Freq: Once | ORAL | Status: AC
  Administered 2012-06-25: 500 mg via ORAL

## 2012-06-25 MED ORDER — ONDANSETRON HCL 4 MG PO TABS
4.0000 mg | ORAL_TABLET | Freq: Once | ORAL | Status: AC
  Administered 2012-06-25: 4 mg via ORAL

## 2012-06-25 MED ORDER — ONDANSETRON HCL 4 MG PO TABS
ORAL_TABLET | ORAL | Status: AC
  Filled 2012-06-25: qty 1

## 2012-06-25 MED ORDER — METRONIDAZOLE 500 MG PO TABS: 500.0000 mg | ORAL_TABLET | Freq: Two times a day (BID) | ORAL | Status: DC

## 2012-06-25 MED ORDER — METRONIDAZOLE 500 MG PO TABS
ORAL_TABLET | ORAL | Status: AC
  Filled 2012-06-25: qty 1

## 2012-06-25 NOTE — ED Notes (Signed)
Pt c/o vaginal discharge and itching x 7 days.  Reports history of bacterial vaginosis.

## 2012-06-25 NOTE — ED Provider Notes (Signed)
History     CSN: [id number]  Arrival date & time 06/25/12  1146   First MD Initiated Contact with Patient 06/25/12 1216      Chief Complaint  Patient presents with  . 74.5     (Consider location/radiation/quality/duration/timing/severity/associated sxs/prior treatment) HPI Comments: Patient is a 47 year old female who presents to the emergency department with 7 days of vaginal discharge and itching. The patient states she is sexually active. She has not been using any new soaps or shower gels or douches. She's not had any injury or trauma to the pubis or vaginal area. Patient denies any fever, nausea or vomiting. Fever nausea vomiting.  patient states she has had bacterial vaginosis in the past. Pt has not taken anything for the discharge. Nothing seem to make this problem better. Nothing seems to make it worse.  The history is provided by the patient.    History reviewed. No pertinent past medical history.  Past Surgical History  Procedure Laterality Date  . Cesarean section      No family history on file.  History  Substance Use Topics  . Smoking status: Current Every Day Smoker -- 0.50 packs/day  . Smokeless tobacco: Not on file  . Alcohol Use: No    OB History   Grav Para Term Preterm Abortions TAB SAB Ect Mult Living                  Review of Systems  Constitutional: Negative for activity change.       All ROS Neg except as noted in HPI  HENT: Negative for nosebleeds and neck pain.   Eyes: Negative for photophobia and discharge.  Respiratory: Negative for cough, shortness of breath and wheezing.   Cardiovascular: Negative for chest pain and palpitations.  Gastrointestinal: Negative for abdominal pain and blood in stool.  Genitourinary: Negative for dysuria, frequency and hematuria.  Musculoskeletal: Negative for back pain and arthralgias.  Skin: Negative.   Neurological: Negative for dizziness, seizures and speech difficulty.  Psychiatric/Behavioral:  Negative for hallucinations and confusion.    Allergies  Review of patient's allergies indicates no known allergies.  Home Medications   Current Outpatient Rx  Name  Route  Sig  Dispense  Refill  . fish oil-omega-3 fatty acids 1000 MG capsule   Oral   Take 4 g by mouth daily.         Marland Kitchen FOLIC ACID PO   Oral   Take 1 tablet by mouth daily.         Marland Kitchen ibuprofen (ADVIL,MOTRIN) 200 MG tablet   Oral   Take 200 mg by mouth every 6 (six) hours as needed. Pain         . Lactobacillus (ACIDOPHILUS) 10 MG CAPS   Oral   Take 10 mg by mouth daily.             BP 107/61  Pulse 95  Temp(Src) 98.1 F (36.7 C) (Oral)  Resp 16  Ht 5\' 4"  (1.626 m)  Wt 185 lb (83.915 kg)  BMI 31.74 kg/m2  SpO2 98%  Physical Exam  Nursing note and vitals reviewed. Constitutional: She is oriented to person, place, and time. She appears well-developed and well-nourished.  Non-toxic appearance.  HENT:  Head: Normocephalic.  Right Ear: Tympanic membrane and external ear normal.  Left Ear: Tympanic membrane and external ear normal.  Eyes: EOM and lids are normal. Pupils are equal, round, and reactive to light.  Neck: Normal range of motion. Neck supple. Carotid  bruit is not present.  Cardiovascular: Normal rate, regular rhythm, normal heart sounds, intact distal pulses and normal pulses.   Pulmonary/Chest: Breath sounds normal. No respiratory distress.  Abdominal: Soft. Bowel sounds are normal. There is no tenderness. There is no guarding.  Genitourinary:  Chaperone present during examination. External genitalia shows no evidence of rash, abscess or other abnormality. There is no foreign body in the vaginal vault. There is heavy yellow to green vaginal discharge present. No cervical wall motion tenderness. No adnexal tenderness or swelling.  Musculoskeletal: Normal range of motion.  Lymphadenopathy:       Head (right side): No submandibular adenopathy present.       Head (left side): No  submandibular adenopathy present.    She has no cervical adenopathy.  Neurological: She is alert and oriented to person, place, and time. She has normal strength. No cranial nerve deficit or sensory deficit.  Skin: Skin is warm and dry.  Psychiatric: She has a normal mood and affect. Her speech is normal.    ED Course  Procedures (including critical care time)  Labs Reviewed  GC/CHLAMYDIA PROBE AMP  WET PREP, GENITAL   No results found. Pulse ox 98% on room air. WNL by my interpretion.  No diagnosis found.    MDM  I have reviewed nursing notes, vital signs, and all appropriate lab and imaging results for this patient. cbg 86.   Wet prep suggest Bact. Vaginosis. Gc culture pending. Rx for flagyl given to the patient with warn ing not to use alcohol while taking flagyl. Pt to follow up at the [facility] for additional evaluation and testing.     Kathie Dike, [place] 06/27/12 [phone]

## 2012-06-29 NOTE — ED Provider Notes (Signed)
Medical screening examination/treatment/procedure(s) were performed by non-physician practitioner and as supervising physician I was immediately available for consultation/collaboration.    Vida Roller, MD 06/29/12 [phone]

## 2013-11-15 ENCOUNTER — Emergency Department (HOSPITAL_COMMUNITY)
Admission: EM | Admit: 2013-11-15 | Discharge: 2013-11-15 | Disposition: A | Discharge status: Home / Self Care | Payer: No Typology Code available for payment source | Attending: Emergency Medicine | Admitting: Emergency Medicine

## 2013-11-15 ENCOUNTER — Encounter (HOSPITAL_COMMUNITY): Payer: Self-pay | Admitting: Emergency Medicine

## 2013-11-15 DIAGNOSIS — Y929 Unspecified place or not applicable: Secondary | ICD-10-CM | POA: Insufficient documentation

## 2013-11-15 DIAGNOSIS — T6391XA Toxic effect of contact with unspecified venomous animal, accidental (unintentional), initial encounter: Secondary | ICD-10-CM | POA: Insufficient documentation

## 2013-11-15 DIAGNOSIS — T63441A Toxic effect of venom of bees, accidental (unintentional), initial encounter: Secondary | ICD-10-CM

## 2013-11-15 DIAGNOSIS — Z79899 Other long term (current) drug therapy: Secondary | ICD-10-CM | POA: Insufficient documentation

## 2013-11-15 DIAGNOSIS — Y9389 Activity, other specified: Secondary | ICD-10-CM | POA: Insufficient documentation

## 2013-11-15 DIAGNOSIS — F17200 Nicotine dependence, unspecified, uncomplicated: Secondary | ICD-10-CM | POA: Insufficient documentation

## 2013-11-15 DIAGNOSIS — T63461A Toxic effect of venom of wasps, accidental (unintentional), initial encounter: Secondary | ICD-10-CM | POA: Insufficient documentation

## 2013-11-15 DIAGNOSIS — A5901 Trichomonal vulvovaginitis: Secondary | ICD-10-CM | POA: Insufficient documentation

## 2013-11-15 LAB — HIV ANTIBODY (ROUTINE TESTING W REFLEX): HIV: NONREACTIVE

## 2013-11-15 LAB — WET PREP, GENITAL: Yeast Wet Prep HPF POC: NONE SEEN

## 2013-11-15 LAB — RPR

## 2013-11-15 MED ORDER — STERILE WATER FOR INJECTION IJ SOLN
INTRAMUSCULAR | Status: AC
  Filled 2013-11-15: qty 10

## 2013-11-15 MED ORDER — CEFTRIAXONE SODIUM 250 MG IJ SOLR
250.0000 mg | Freq: Once | INTRAMUSCULAR | Status: AC
  Administered 2013-11-15: 250 mg via INTRAMUSCULAR
  Filled 2013-11-15: qty 250

## 2013-11-15 MED ORDER — CETIRIZINE HCL 10 MG PO TABS: 10.0000 mg | ORAL_TABLET | Freq: Every day | ORAL | Status: DC

## 2013-11-15 MED ORDER — METRONIDAZOLE 500 MG PO TABS
500.0000 mg | ORAL_TABLET | Freq: Two times a day (BID) | ORAL | Status: DC
Start: 2013-11-15 — End: 2013-11-19

## 2013-11-15 MED ORDER — AZITHROMYCIN 250 MG PO TABS
1000.0000 mg | ORAL_TABLET | Freq: Once | ORAL | Status: AC
  Administered 2013-11-15: 1000 mg via ORAL
  Filled 2013-11-15: qty 4

## 2013-11-15 MED ORDER — DIPHENHYDRAMINE HCL 25 MG PO CAPS
25.0000 mg | ORAL_CAPSULE | Freq: Once | ORAL | Status: AC
  Administered 2013-11-15: 25 mg via ORAL
  Filled 2013-11-15: qty 1

## 2013-11-15 NOTE — Discharge Instructions (Signed)
Follow up with the health department for your pap smear and a yearly check up. Be sure your sex partner is treated at the same time as you are for the trichomonas and no sex for 2 weeks after you have both been treated.

## 2013-11-15 NOTE — ED Provider Notes (Signed)
CSN: [id number]     Arrival date & time 11/15/13  1107 History   This chart was scribed for non-physician practitioner [facility] M. Damian Leavell, NP, working with Hilario Quarry, MD, by Yevette Edwards, ED Scribe. This patient was seen in room APFT22/APFT22 and the patient's care was started at 11:40 AM. First MD Initiated Contact with Patient 11/15/13 1115     Chief Complaint  Patient presents with  . Vaginal Discharge    The history is provided by the patient. No language interpreter was used.   HPI Comments: Rebecca Barron is a 48 y.o. female, with a h/o BV and trichamonas, who presents to the Emergency Department complaining of malodorous vaginal discharge which began three days ago. The pt states the current symptoms are similar to previous episodes of BV.  She reports a new sexual partner last week; her last sexual partner was a month prior, and she endorses two sexual partners in the past six months. Per medical records, the pt was treated for BV five months ago and she was treated for trichamonas two years ago. She reports her LNMP was five years ago; she denies a hysterectomy. She reports her last pap smear was at least five years ago.  Ms. Herling also reports a possible hornet sting to her right lateral thigh which occurred three hours ago. She reports associated pain and mild swelling to the site. The pt denies SOB or difficulty swallowing.   History reviewed. No pertinent past medical history. Past Surgical History  Procedure Laterality Date  . Cesarean section     No family history on file. History  Substance Use Topics  . Smoking status: Current Every Day Smoker -- 0.50 packs/day  . Smokeless tobacco: Not on file  . Alcohol Use: No   No OB history provided.  Review of Systems  Constitutional: Negative for fever.  HENT: Negative for trouble swallowing.   Respiratory: Negative for shortness of breath.   Gastrointestinal: Negative for abdominal pain.  Genitourinary: Positive for  vaginal discharge. Negative for menstrual problem.  Skin:       Possible insect sting  All other systems reviewed and are negative.   Allergies  Review of patient's allergies indicates no known allergies.  Home Medications   Prior to Admission medications   Medication Sig Start Date End Date Taking? Authorizing Provider  fish oil-omega-3 fatty acids 1000 MG capsule Take 4 g by mouth daily.    Historical Provider, MD  FOLIC ACID PO Take 1 tablet by mouth daily.    Historical Provider, MD  ibuprofen (ADVIL,MOTRIN) 200 MG tablet Take 200 mg by mouth every 6 (six) hours as needed. Pain    Historical Provider, MD  Lactobacillus (ACIDOPHILUS) 10 MG CAPS Take 10 mg by mouth daily.      Historical Provider, MD  metroNIDAZOLE (FLAGYL) 500 MG tablet Take 1 tablet (500 mg total) by mouth 2 (two) times daily. 06/25/12   Kathie Dike, PA-C   Triage Vitals: BP 116/67  Pulse 90  Temp(Src) 98 F (36.7 C) (Oral)  Resp 18  Ht 5\' 5"  (1.651 m)  Wt 190 lb (86.183 kg)  BMI 31.62 kg/m2  SpO2 98%  Physical Exam  Nursing note and vitals reviewed. Constitutional: She is oriented to person, place, and time. She appears well-developed and well-nourished. No distress.  HENT:  Head: Normocephalic and atraumatic.  Mouth/Throat: Uvula is midline, oropharynx is clear and moist and mucous membranes are normal. No oropharyngeal exudate, posterior oropharyngeal edema or posterior  oropharyngeal erythema.  Moist mucous membranes.   Eyes: Conjunctivae and EOM are normal.  Neck: Neck supple. No tracheal deviation present.  Cardiovascular: Normal rate, regular rhythm and normal heart sounds.   Pulmonary/Chest: Effort normal and breath sounds normal. No respiratory distress. She has no wheezes. She has no rales.  Abdominal: Soft. There is no tenderness.  Genitourinary: There is no rash, tenderness or lesion on the right labia. There is no rash, tenderness or lesion on the left labia. Uterus is not enlarged. Cervix  exhibits no motion tenderness. Right adnexum displays no mass and no tenderness. Left adnexum displays no mass and no tenderness. No erythema around the vagina. No foreign body around the vagina. Vaginal discharge found.  Frothy yellow discharge in the vaginal vault.  No cervical motion tenderness.  No adnexal tenderness.  No mass palpable. Uterus without palpable enlargement.   Musculoskeletal: Normal range of motion.  Neurological: She is alert and oriented to person, place, and time.  Skin: Skin is warm and dry.  Localized reaction to hornet sting on right lateral thigh. No increased warmth. Area of edema surrounding the area where the sting occurred.   Psychiatric: She has a normal mood and affect. Her behavior is normal.    ED Course  Procedures (including critical care time)  DIAGNOSTIC STUDIES: Oxygen Saturation is 98% on room air, normal by my interpretation.    COORDINATION OF CARE:  11:49 AM- Discussed treatment plan with patient, and the patient agreed to the plan. The plan includes a pelvic exam and STI lab work. Advised pt to follow-up with her PCP for a pap smear.   Labs Review Results for orders placed during the hospital encounter of 11/15/13 (from the past 24 hour(s))  WET PREP, GENITAL     Status: Abnormal   Collection Time    11/15/13 12:20 PM      Result Value Ref Range   Yeast Wet Prep HPF POC NONE SEEN  NONE SEEN   Trich, Wet Prep MODERATE (*) NONE SEEN   Clue Cells Wet Prep HPF POC MODERATE (*) NONE SEEN   WBC, Wet Prep HPF POC TOO NUMEROUS TO COUNT (*) NONE SEEN    GC, Chlamydia cultures pending.  MDM  48 y.o. female with vaginal discharge that has an odor and hx of BV. Will treat for BV and trichomonas. Also localized reaction to bee sting on right thigh. Stable for discharge without respiratory symptoms or difficulty swallowing. No abdominal pain.  I have reviewed this patient's vital signs, nurses notes, appropriate labs and discussed findings and plan  of care with the patient. She voices understanding and agrees with plan.    Medication List         cetirizine 10 MG tablet  Commonly known as:  ZYRTEC  Take 1 tablet (10 mg total) by mouth daily.     metroNIDAZOLE 500 MG tablet  Commonly known as:  FLAGYL  Take 1 tablet (500 mg total) by mouth 2 (two) times daily.        I personally performed the services described in this documentation, which was scribed in my presence. The recorded information has been reviewed and is accurate.    [place]odes[place] M Ne[place], [place] 11/15/13 2051

## 2013-11-15 NOTE — ED Notes (Signed)
Pt states she has had a yeast infection for a week. Also, stung on the right buttock by a hornet this am

## 2013-11-16 LAB — GC/CHLAMYDIA PROBE AMP
CT Probe RNA: NEGATIVE
GC Probe RNA: NEGATIVE

## 2013-11-16 NOTE — ED Provider Notes (Signed)
History/physical exam/procedure(s) were performed by non-physician practitioner and as supervising physician I was immediately available for consultation/collaboration. I have reviewed all notes and am in agreement with care and plan.   Tina Temme S Natalija Mavis, MD 11/16/13 1523 

## 2013-11-19 ENCOUNTER — Encounter: Payer: Self-pay | Admitting: *Deleted

## 2013-12-24 ENCOUNTER — Encounter (HOSPITAL_COMMUNITY): Payer: Self-pay | Admitting: Emergency Medicine

## 2013-12-24 ENCOUNTER — Emergency Department (HOSPITAL_COMMUNITY)
Admission: EM | Admit: 2013-12-24 | Discharge: 2013-12-24 | Disposition: A | Discharge status: Home / Self Care | Payer: No Typology Code available for payment source | Attending: Emergency Medicine | Admitting: Emergency Medicine

## 2013-12-24 ENCOUNTER — Emergency Department (HOSPITAL_COMMUNITY): Payer: No Typology Code available for payment source

## 2013-12-24 DIAGNOSIS — F17200 Nicotine dependence, unspecified, uncomplicated: Secondary | ICD-10-CM | POA: Diagnosis not present

## 2013-12-24 DIAGNOSIS — M542 Cervicalgia: Secondary | ICD-10-CM | POA: Diagnosis not present

## 2013-12-24 DIAGNOSIS — R61 Generalized hyperhidrosis: Secondary | ICD-10-CM | POA: Diagnosis not present

## 2013-12-24 DIAGNOSIS — F3289 Other specified depressive episodes: Secondary | ICD-10-CM | POA: Diagnosis not present

## 2013-12-24 DIAGNOSIS — M25512 Pain in left shoulder: Secondary | ICD-10-CM

## 2013-12-24 DIAGNOSIS — M25519 Pain in unspecified shoulder: Secondary | ICD-10-CM | POA: Insufficient documentation

## 2013-12-24 DIAGNOSIS — F329 Major depressive disorder, single episode, unspecified: Secondary | ICD-10-CM | POA: Diagnosis not present

## 2013-12-24 NOTE — Discharge Instructions (Signed)
.  °  SEEK IMMEDIATE MEDICAL ATTENTION IF: You have severe chest pain, especially if the pain is crushing or pressure-like and spreads to the arms, back, neck, or jaw, or if you have sweating, nausea (feeling sick to your stomach), or shortness of breath. THIS IS AN EMERGENCY. Don't wait to see if the pain will go away. Get medical help at once. Call 911 or 0 (operator). DO NOT drive yourself to the hospital.  Your chest pain gets worse and does not go away with rest.  You have an attack of chest pain lasting longer than usual, despite rest and treatment with the medications your caregiver has prescribed.  You wake from sleep with chest pain or shortness of breath.  You feel dizzy or faint.  You have chest pain not typical of your usual pain for which you originally saw your caregiver.     SEEK IMMEDIATE MEDICAL ATTENTION IF: You develop difficulties swallowing or breathing.  You have new or worse numbness, weakness, tingling, or movement problems in your arms or legs.  You develop increasing pain which is uncontrolled with medications.  You have change in bowel or bladder function, or other concerns.

## 2013-12-24 NOTE — ED Notes (Signed)
nad noted prior to dc. Dc instructions reviewed. Voiced understanding.

## 2013-12-24 NOTE — ED Notes (Signed)
Pt states pain in lt shoulder started yesterday, hurts to move arm. Pt denies injury, pain worse since yesterday, started as sore spot and progressed. Pt states hurts worse to move arm. Pt tearful in triage.

## 2013-12-24 NOTE — ED Provider Notes (Signed)
CSN: [id number]     Arrival date & time 12/24/13  1107 History   This chart was scribed for Joya Gaskins, MD, by Yevette Edwards, ED Scribe. This patient was seen in room APA14/APA14 and the patient's care was started at 1:13 PM First MD Initiated Contact with Patient 12/24/13 1310     Chief Complaint  Patient presents with  . Shoulder Pain    lt    Patient is a 48 y.o. female presenting with shoulder pain. The history is provided by the patient. No language interpreter was used.  Shoulder Pain This is a new problem. The current episode started 12 to 24 hours ago. The problem occurs constantly. The problem has been gradually worsening. Pertinent negatives include no chest pain and no abdominal pain. The symptoms are aggravated by exertion. Nothing relieves the symptoms. She has tried acetaminophen and rest for the symptoms. The treatment provided no relief.   HPI Comments: Rebecca Barron is a 48 y.o. female who presents to the Emergency Department complaining of left shoulder pain which began yesterday and has gradually worsened. She reports the pain limits her ability to raise her left arm. Rebecca Barron has used OTC medication and two tramadol without resolution. She also denies injury or trauma though she reports she lifted a heavy object. The pain is not increased with palpation. She also endorses lateral neck pain and diaphoresis. She  denies fever, emesis, SOB, abdominal pain, or weakness to her extremities. She denies a h/o MI. She also denies a h/o shouler surgery. Rebecca Barron is a current smoker.    The pt is a new patient of Dr. Jeanice Lim.  She used tramadol from a friend (advised pt not to take other's medications)  Past Medical History  Diagnosis Date  . Depression    Past Surgical History  Procedure Laterality Date  . Cesarean section     Family History  Problem Relation Age of Onset  . Heart disease Father   . Cancer Paternal Grandmother   . Early death Paternal Grandmother     History  Substance Use Topics  . Smoking status: Current Every Day Smoker -- 0.50 packs/day    Types: Cigarettes  . Smokeless tobacco: Never Used  . Alcohol Use: No   No OB history provided.  Review of Systems  Constitutional: Negative for fever.  Cardiovascular: Negative for chest pain.  Gastrointestinal: Negative for vomiting and abdominal pain.  Musculoskeletal: Positive for arthralgias and neck pain.  Neurological: Negative for weakness and numbness.    Allergies  Review of patient's allergies indicates no known allergies.  Home Medications   Prior to Admission medications   Medication Sig Start Date End Date Taking? Authorizing Provider  methocarbamol (ROBAXIN) 500 MG tablet Take 500 mg by mouth as needed for muscle spasms.    Historical Provider, MD  TRAMADOL HCL PO Take 2 tablets by mouth as needed (pain).    Historical Provider, MD   Triage Vitals: BP 133/76  Pulse 100  Temp(Src) 97.9 F (36.6 C) (Oral)  Resp 25  Ht  (1.651 m)  Wt 190 lb (86.183 kg)  BMI 31.62 kg/m2  SpO2 100%  Physical Exam  CONSTITUTIONAL: Well developed/well nourished HEAD: Normocephalic/atraumatic EYES: EOMI/PERRL ENMT: Mucous membranes moist NECK: supple no meningeal signs SPINE:entire spine nontender CV: S1/S2 noted, no murmurs/rubs/gallops noted LUNGS: Lungs are clear to auscultation bilaterally, no apparent distress ABDOMEN: soft, nontender, no rebound or guarding GU:no cva tenderness NEURO: Pt is awake/alert, moves all extremitiesx4, Equal  power (5/5) with hand grip, wrist flex/extension, elbow flex/extension, and equal power with shoulder abduction/adduction.  No focal sensory deficit to light touch is noted in either UE.   Equal (2+) biceps/brachioradialis/tricep reflex in bilateral UE EXTREMITIES: pulses normal, full ROM, pain is reproduced in left chest with movement of shoulder.  No deformity or warmth noted to left shoulder.  She can range left shoulder without  difficulty SKIN: warm, color normal PSYCH: no abnormalities of mood noted   ED Course  Procedures   DIAGNOSTIC STUDIES: Oxygen Saturation is 100% on room air, normal by my interpretation.    COORDINATION OF CARE:  1:21 PM- Discussed treatment plan with patient, and the patient agreed to the plan. The plan includes use of IBU and a heating pad. Gave return precautions.    Pt well appearing, no distress, no focal neuro deficits Low suspicion for occult ACS  No signs of bony injury.  I doubt septic arthritis Advised NSAIDs and f/u with PCP Labs Review Labs Reviewed - No data to display  Imaging Review Dg Shoulder Left  12/24/2013   CLINICAL DATA:  Left shoulder pain.  EXAM: LEFT SHOULDER - 2+ VIEW  COMPARISON:  None.  FINDINGS: The left shoulder is located. No acute bone or soft tissue abnormalities are present. Clavicle is intact. The visualized hemi thorax is clear.  IMPRESSION: Negative left shoulder radiographs.   Electronically Signed   By: Gennette Pac M.D.   On: 12/24/2013 12:29     EKG Interpretation   Date/Time:  Thursday December 24 2013 11:25:36 EDT Ventricular Rate:  72 PR Interval:  151 QRS Duration: 86 QT Interval:  413 QTC Calculation: 452 R Axis:   46 Text Interpretation:  Sinus rhythm T wave inversion in aVL No previous  ECGs available Confirmed by Bebe Shaggy  MD, Dorinda Barron ([id number]) on 12/24/2013  1:11:45 PM      MDM   Final diagnoses:  Left shoulder pain    Nursing notes including past medical history and social history reviewed and considered in documentation xrays reviewed and considered   I personally performed the services described in this documentation, which was scribed in my presence. The recorded information has been reviewed and is accurate.      Joya Gaskins, MD 12/24/13 [phone]

## 2013-12-30 ENCOUNTER — Ambulatory Visit: Payer: No Typology Code available for payment source | Admitting: Family Medicine

## 2014-07-13 ENCOUNTER — Telehealth: Payer: Self-pay | Admitting: Family Medicine

## 2014-07-13 NOTE — Telephone Encounter (Signed)
Patient aware that our 1st new patient appointment was not until the 1st of May patient will try to be seen sooner.

## 2014-07-14 ENCOUNTER — Emergency Department (HOSPITAL_COMMUNITY)
Admission: EM | Admit: 2014-07-14 | Discharge: 2014-07-14 | Disposition: A | Discharge status: Home / Self Care | Payer: Managed Care, Other (non HMO) | Attending: Emergency Medicine | Admitting: Emergency Medicine

## 2014-07-14 ENCOUNTER — Encounter (HOSPITAL_COMMUNITY): Payer: Self-pay | Admitting: Emergency Medicine

## 2014-07-14 ENCOUNTER — Emergency Department (HOSPITAL_COMMUNITY): Payer: Managed Care, Other (non HMO)

## 2014-07-14 DIAGNOSIS — Z720 Tobacco use: Secondary | ICD-10-CM | POA: Diagnosis not present

## 2014-07-14 DIAGNOSIS — Z8659 Personal history of other mental and behavioral disorders: Secondary | ICD-10-CM | POA: Insufficient documentation

## 2014-07-14 DIAGNOSIS — J069 Acute upper respiratory infection, unspecified: Secondary | ICD-10-CM | POA: Insufficient documentation

## 2014-07-14 DIAGNOSIS — R52 Pain, unspecified: Secondary | ICD-10-CM | POA: Diagnosis present

## 2014-07-14 DIAGNOSIS — R1110 Vomiting, unspecified: Secondary | ICD-10-CM | POA: Diagnosis not present

## 2014-07-14 MED ORDER — NALOXONE HCL 0.4 MG/ML IJ SOLN
0.2000 mg | Freq: Once | INTRAMUSCULAR | Status: DC
Start: 2014-07-14 — End: 2014-07-14

## 2014-07-14 MED ORDER — GUAIFENESIN-DM 100-10 MG/5ML PO SYRP: 5.0000 mL | ORAL_SOLUTION | ORAL | Status: DC | PRN

## 2014-07-14 NOTE — ED Provider Notes (Signed)
CSN: [id number]     Arrival date & time 07/14/14  4098 History  This chart was scribed for Benjiman Core, MD by Luisa Dago, Medical Scribe. This patient was seen in room APA03/APA03 and the patient's care was started at 7:59 AM.     Chief Complaint  Patient presents with  . Generalized Body Aches   The history is provided by the patient and medical records. No language interpreter was used.   HPI Comments: Rebecca Barron is a 49 y.o. female with PMhx of tobacco use (1pack/ daily) presents to the Emergency Department complaining of sudden onset gradually worsening dry cough that started 1 week ago. Pt is also complaining of associated emesis secondary to the forceful nature of the cough, lower right abdominal pain, hoarseness, and chills. She endorses one sick contact prior to the start of her symptoms. Pt denies any fever, neck pain, visual disturbance, CP,  SOB, nausea, diarrhea, urinary symptoms, back pain, HA, weakness, numbness and rash as associated symptoms.     Past Medical History  Diagnosis Date  . Depression    Past Surgical History  Procedure Laterality Date  . Cesarean section     Family History  Problem Relation Age of Onset  . Heart disease Father   . Cancer Paternal Grandmother   . Early death Paternal Grandmother    History  Substance Use Topics  . Smoking status: Current Every Day Smoker -- 0.50 packs/day    Types: Cigarettes  . Smokeless tobacco: Never Used  . Alcohol Use: No   OB History    No data available     Review of Systems  Constitutional: Positive for chills. Negative for fever.  HENT: Positive for congestion and voice change. Negative for ear pain and sore throat.   Eyes: Negative for discharge.  Respiratory: Positive for cough. Negative for shortness of breath, wheezing and stridor.   Cardiovascular: Negative for chest pain.  Gastrointestinal: Positive for vomiting and abdominal pain.  Genitourinary: Negative.   All other systems  reviewed and are negative.  Allergies  Review of patient's allergies indicates no known allergies.  Home Medications   Prior to Admission medications   Medication Sig Start Date End Date Taking? Authorizing Provider  guaiFENesin-dextromethorphan (ROBITUSSIN DM) 100-10 MG/5ML syrup Take 5 mLs by mouth every 4 (four) hours as needed for cough. 07/14/14   Benjiman Core, MD   BP 117/64 mmHg  Pulse 65  Temp(Src) 97.8 F (36.6 C) (Oral)  Resp 18  Ht  (1.626 m)  Wt 195 lb (88.451 kg)  BMI 33.46 kg/m2  SpO2 95%   Physical Exam  Constitutional: She appears well-developed and well-nourished. No distress.  HENT:  Head: Normocephalic and atraumatic.  Mouth/Throat: Posterior oropharyngeal erythema present. No oropharyngeal exudate.  Erythematous posterior oropharynx with a few vesicles.  Eyes: Conjunctivae are normal. Right eye exhibits no discharge. Left eye exhibits no discharge.  Neck: Normal range of motion. Neck supple.  Cardiovascular: Normal rate, regular rhythm and normal heart sounds.  Exam reveals no gallop and no friction rub.   No murmur heard. Pulmonary/Chest: Effort normal and breath sounds normal. No respiratory distress. She has no wheezes. She has no rales.  Abdominal: Soft. She exhibits no distension. There is no tenderness.  Musculoskeletal: She exhibits no edema or tenderness.  Lymphadenopathy:    She has cervical adenopathy.  Slight anterior cervical adenopathy  Neurological: She is alert.  Skin: Skin is warm and dry.  Psychiatric: She has a normal mood and  affect. Her behavior is normal. Thought content normal.  Nursing note and vitals reviewed.     ED Course  Procedures (including critical care time)  COORDINATION OF CARE: 8:04 AM- Pt advised of plan for treatment and pt agrees.   Dg Chest 2 View  07/14/2014   CLINICAL DATA:  26Fifty year old female with increasing cough. Vomiting. Chills. Initial encounter.  EXAM: CHEST  2 VIEW  COMPARISON:   08/10/2011.  FINDINGS: Normal lung volumes with no pleural effusion or consolidation, however, increased interstitial markings are noted bilaterally and on the lateral view there is suggestion of mildly increased central peribronchial opacity, not evident on the frontal view. Normal cardiac size and mediastinal contours. Visualized tracheal air column is within normal limits. No pneumothorax. No acute osseous abnormality identified. Negative visible bowel gas pattern. No pneumoperitoneum.  IMPRESSION: Suggestion of increased interstitial and bilateral central peribronchial opacity such as due to acute viral/atypical respiratory infection. No focal pneumonia. No pleural effusion.   Electronically Signed   By: Odessa FlemingH  Hall M.D.   On: 07/14/2014 08:03      MDM   Final diagnoses:  URI (upper respiratory infection)    Patient with cough. Generalized body aches. Appears to be viral versus atypical on x-ray. Well-appearing. Will treat as viral at this time.  I personally performed the services described in this documentation, which was scribed in my presence. The recorded information has been reviewed and is accurate.     Benjiman CoreNathan Keelie Zemanek, MD 07/14/14 1539

## 2014-07-14 NOTE — Discharge Instructions (Signed)
Upper Respiratory Infection, Adult An upper respiratory infection (URI) is also sometimes known as the common cold. The upper respiratory tract includes the nose, sinuses, throat, trachea, and bronchi. Bronchi are the airways leading to the lungs. Most people improve within 1 week, but symptoms can last up to 2 weeks. A residual cough may last even longer.  CAUSES Many different viruses can infect the tissues lining the upper respiratory tract. The tissues become irritated and inflamed and often become very moist. Mucus production is also common. A cold is contagious. You can easily spread the virus to others by oral contact. This includes kissing, sharing a glass, coughing, or sneezing. Touching your mouth or nose and then touching a surface, which is then touched by another person, can also spread the virus. SYMPTOMS  Symptoms typically develop 1 to 3 days after you come in contact with a cold virus. Symptoms vary from person to person. They may include:  Runny nose.  Sneezing.  Nasal congestion.  Sinus irritation.  Sore throat.  Loss of voice (laryngitis).  Cough.  Fatigue.  Muscle aches.  Loss of appetite.  Headache.  Low-grade fever. DIAGNOSIS  You might diagnose your own cold based on familiar symptoms, since most people get a cold 2 to 3 times a year. Your caregiver can confirm this based on your exam. Most importantly, your caregiver can check that your symptoms are not due to another disease such as strep throat, sinusitis, pneumonia, asthma, or epiglottitis. Blood tests, throat tests, and X-rays are not necessary to diagnose a common cold, but they may sometimes be helpful in excluding other more serious diseases. Your caregiver will decide if any further tests are required. RISKS AND COMPLICATIONS  You may be at risk for a more severe case of the common cold if you smoke cigarettes, have chronic heart disease (such as heart failure) or lung disease (such as asthma), or if  you have a weakened immune system. The very young and very old are also at risk for more serious infections. Bacterial sinusitis, middle ear infections, and bacterial pneumonia can complicate the common cold. The common cold can worsen asthma and chronic obstructive pulmonary disease (COPD). Sometimes, these complications can require emergency medical care and may be life-threatening. PREVENTION  The best way to protect against getting a cold is to practice good hygiene. Avoid oral or hand contact with people with cold symptoms. Wash your hands often if contact occurs. There is no clear evidence that vitamin C, vitamin E, echinacea, or exercise reduces the chance of developing a cold. However, it is always recommended to get plenty of rest and practice good nutrition. TREATMENT  Treatment is directed at relieving symptoms. There is no cure. Antibiotics are not effective, because the infection is caused by a virus, not by bacteria. Treatment may include:  Increased fluid intake. Sports drinks offer valuable electrolytes, sugars, and fluids.  Breathing heated mist or steam (vaporizer or shower).  Eating chicken soup or other clear broths, and maintaining good nutrition.  Getting plenty of rest.  Using gargles or lozenges for comfort.  Controlling fevers with ibuprofen or acetaminophen as directed by your caregiver.  Increasing usage of your inhaler if you have asthma. Zinc gel and zinc lozenges, taken in the first 24 hours of the common cold, can shorten the duration and lessen the severity of symptoms. Pain medicines may help with fever, muscle aches, and throat pain. A variety of non-prescription medicines are available to treat congestion and runny nose. Your caregiver   can make recommendations and may suggest nasal or lung inhalers for other symptoms.  HOME CARE INSTRUCTIONS   Only take over-the-counter or prescription medicines for pain, discomfort, or fever as directed by your  caregiver.  Use a warm mist humidifier or inhale steam from a shower to increase air moisture. This may keep secretions moist and make it easier to breathe.  Drink enough water and fluids to keep your urine clear or pale yellow.  Rest as needed.  Return to work when your temperature has returned to normal or as your caregiver advises. You may need to stay home longer to avoid infecting others. You can also use a face mask and careful hand washing to prevent spread of the virus. SEEK MEDICAL CARE IF:   After the first few days, you feel you are getting worse rather than better.  You need your caregiver's advice about medicines to control symptoms.  You develop chills, worsening shortness of breath, or brown or red sputum. These may be signs of pneumonia.  You develop yellow or brown nasal discharge or pain in the face, especially when you bend forward. These may be signs of sinusitis.  You develop a fever, swollen neck glands, pain with swallowing, or white areas in the back of your throat. These may be signs of strep throat. SEEK IMMEDIATE MEDICAL CARE IF:   You have a fever.  You develop severe or persistent headache, ear pain, sinus pain, or chest pain.  You develop wheezing, a prolonged cough, cough up blood, or have a change in your usual mucus (if you have chronic lung disease).  You develop sore muscles or a stiff neck. Document Released: 10/10/2000 Document Revised: 07/09/2011 Document Reviewed: 07/22/2013 [employer] Patient Information 2015 [employer]. This information is not intended to replace advice given to you by your health care provider. Make sure you discuss any questions you have with your health care provider.  

## 2014-07-14 NOTE — ED Notes (Addendum)
Pt reports vomiting with coughing,lower abdominal pain, chills since Wednesday.

## 2016-07-08 IMAGING — DX DG CHEST 2V
2 series · 2 of 2 positions shown · non-contrast
Comparison: 08/10/2011.

CLINICAL DATA: [age] year old male with increasing cough. Vomiting.
Chills. Initial encounter.

EXAM:
CHEST  2 VIEW

[chest pa]
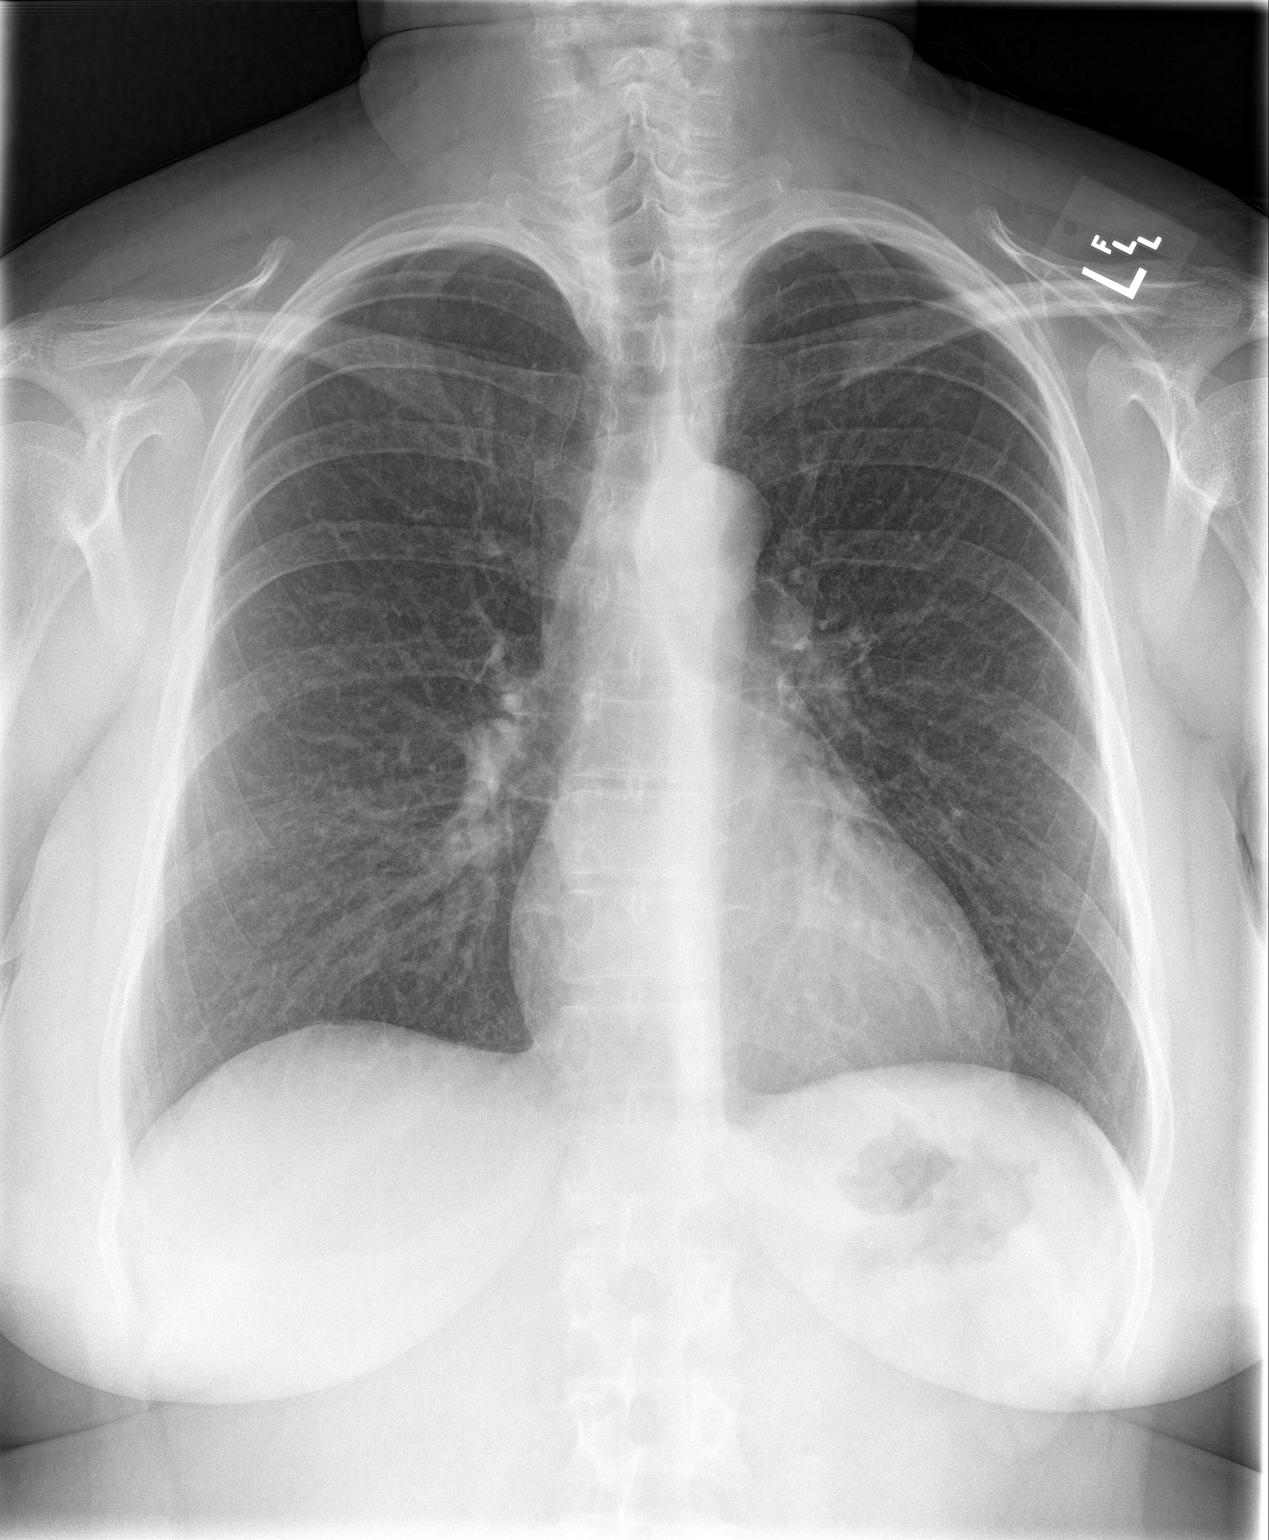

[chest lat]
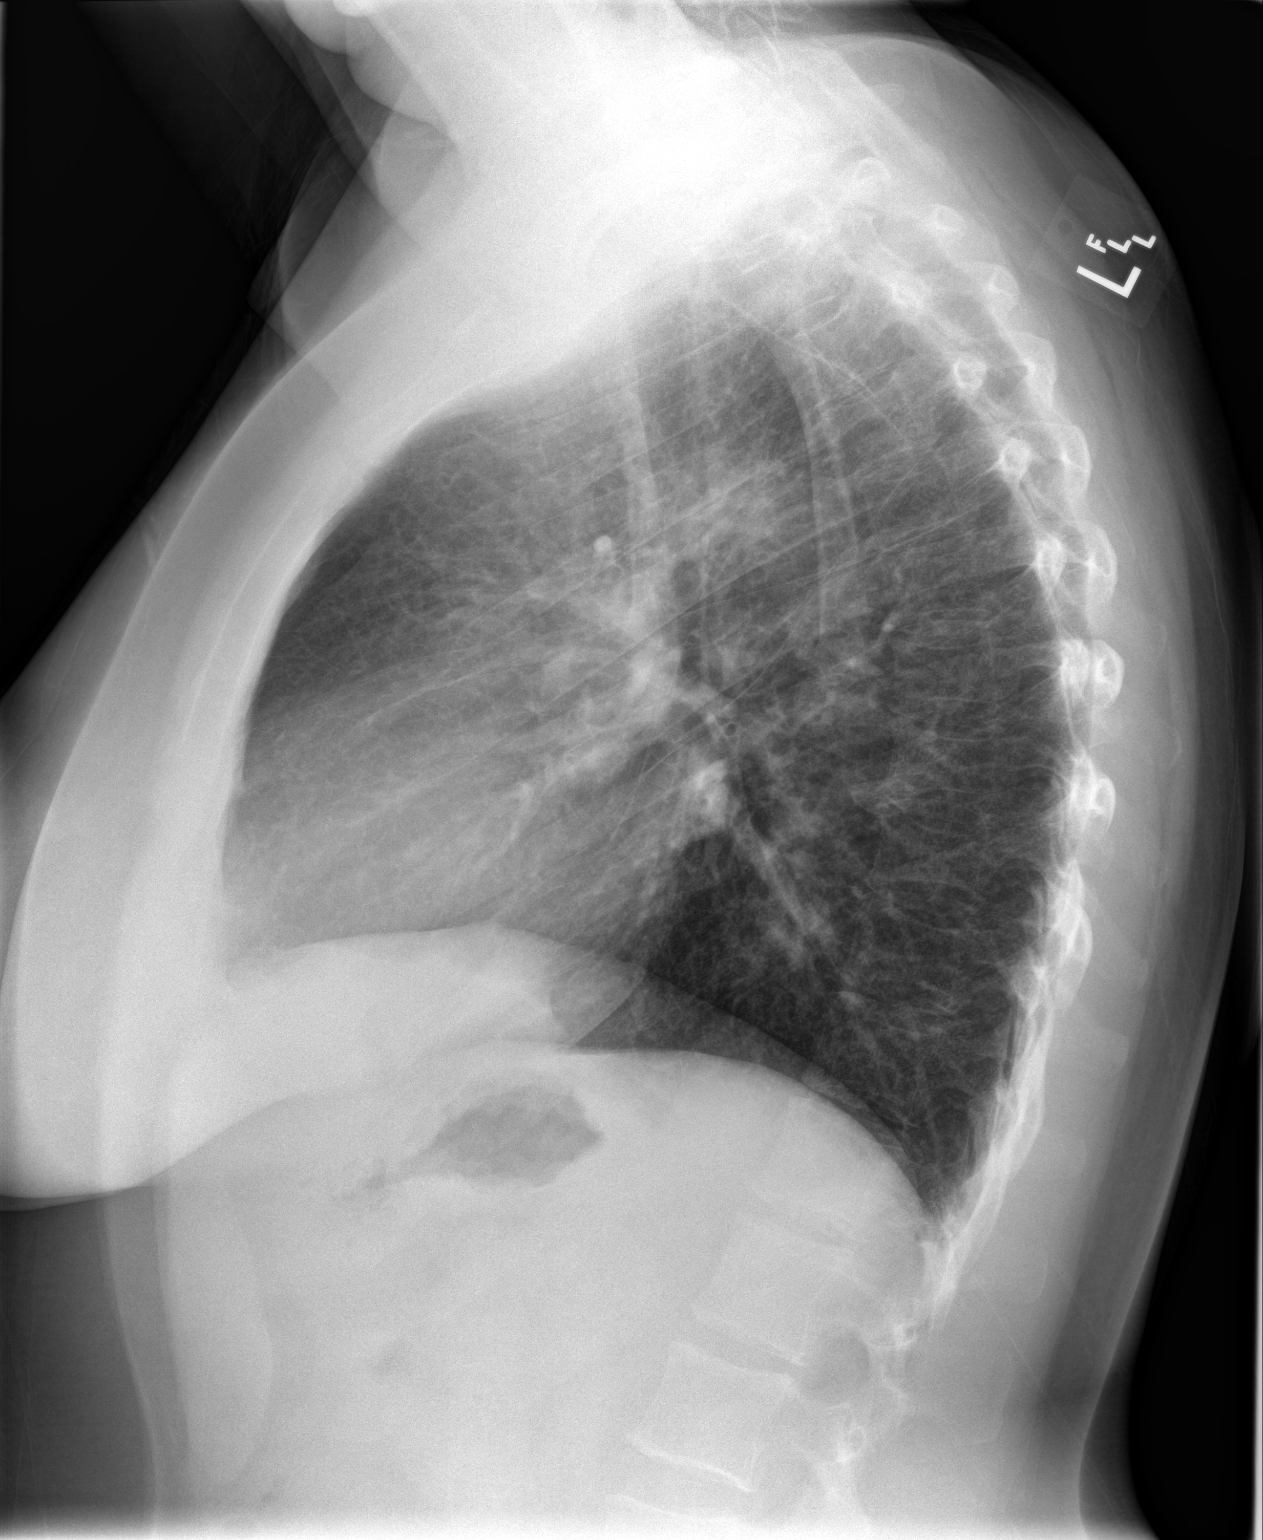

[2 of 2 positions shown; findings below may reference images not displayed]

FINDINGS: Normal lung volumes with no pleural effusion or consolidation,
however, increased interstitial markings are noted bilaterally and
on the lateral view there is suggestion of mildly increased central
peribronchial opacity, not evident on the frontal view. Normal
cardiac size and mediastinal contours. Visualized tracheal air
column is within normal limits. No pneumothorax. No acute osseous
abnormality identified. Negative visible bowel gas pattern. No
pneumoperitoneum.
IMPRESSION: Suggestion of increased interstitial and bilateral central
peribronchial opacity such as due to acute viral/atypical
respiratory infection. No focal pneumonia. No pleural effusion.

## 2017-12-28 ENCOUNTER — Emergency Department (HOSPITAL_COMMUNITY)
Admission: EM | Admit: 2017-12-28 | Discharge: 2017-12-28 | Disposition: A | Discharge status: Home / Self Care | Payer: Self-pay | Attending: Emergency Medicine | Admitting: Emergency Medicine

## 2017-12-28 ENCOUNTER — Emergency Department (HOSPITAL_COMMUNITY): Payer: Self-pay

## 2017-12-28 ENCOUNTER — Encounter (HOSPITAL_COMMUNITY): Payer: Self-pay | Admitting: Emergency Medicine

## 2017-12-28 ENCOUNTER — Other Ambulatory Visit: Payer: Self-pay

## 2017-12-28 DIAGNOSIS — F17210 Nicotine dependence, cigarettes, uncomplicated: Secondary | ICD-10-CM | POA: Insufficient documentation

## 2017-12-28 DIAGNOSIS — M1120 Other chondrocalcinosis, unspecified site: Secondary | ICD-10-CM | POA: Insufficient documentation

## 2017-12-28 DIAGNOSIS — Z79899 Other long term (current) drug therapy: Secondary | ICD-10-CM | POA: Insufficient documentation

## 2017-12-28 MED ORDER — PREDNISONE 10 MG PO TABS: ORAL_TABLET | ORAL | 0 refills | Status: AC

## 2017-12-28 MED ORDER — PREDNISONE 50 MG PO TABS
60.0000 mg | ORAL_TABLET | Freq: Once | ORAL | Status: AC
  Administered 2017-12-28: 60 mg via ORAL
  Filled 2017-12-28: qty 1

## 2017-12-28 NOTE — ED Triage Notes (Signed)
Pt c/o off and on RT knee pain x 6 months. Denies recent injury. Pt ambulatory.

## 2017-12-28 NOTE — ED Provider Notes (Signed)
[facility] EMERGENCY DEPARTMENT Provider Note   CSN: [id number] Arrival date & time: 12/28/17  5784     History   Chief Complaint Chief Complaint  Patient presents with  . Knee Pain    HPI Rebecca Barron is a 52 y.o. female with no significant past medical history presenting with right knee pain which has been intermittent for the past 6 months.  She denies any specific injury, but does endorse the habit of sitting at work with her right leg crossed underneath her and suspects this may cause knee strain.  She woke today with increasing pain with weightbearing and ambulation.  She denies any radiation of pain.  She has had no medications prior to arrival for this condition.  She has no ankle or hip pain.  The history is provided by the patient.    Past Medical History:  Diagnosis Date  . Depression     There are no active problems to display for this patient.   Past Surgical History:  Procedure Laterality Date  . CESAREAN SECTION       OB History   None      Home Medications    Prior to Admission medications   Medication Sig Start Date End Date Taking? Authorizing Provider  predniSONE (DELTASONE) 10 MG tablet Take 6 tablets day one, 5 tablets day two, 4 tablets day three, 3 tablets day four, 2 tablets day five, then 1 tablet day six 12/28/17   Latriece Anstine, Raynelle Fanning, PA-C  phentermine (ADIPEX-P) 37.5 MG tablet Take 37.5 mg by mouth daily before breakfast.    07/11/11  [provider]    Family History Family History  Problem Relation Age of Onset  . Heart disease Father   . Cancer Paternal Grandmother   . Early death Paternal Grandmother     Social History Social History   Tobacco Use  . Smoking status: Current Every Day Smoker    Packs/day: 1.00    Types: Cigarettes  . Smokeless tobacco: Never Used  Substance Use Topics  . Alcohol use: No  . Drug use: No     Allergies   Patient has no known allergies.   Review of Systems Review of Systems    Constitutional: Negative for fever.  Musculoskeletal: Positive for arthralgias. Negative for joint swelling and myalgias.  Neurological: Negative for weakness and numbness.     Physical Exam Updated Vital Signs BP 117/89 (BP Location: Right Arm)   Pulse 84   Temp 98.4 F (36.9 C) (Oral)   Resp 16   Ht 5\' 4"  (1.626 m)   Wt 83.9 kg   SpO2 96%   BMI 31.76 kg/m   Physical Exam  Constitutional: She appears well-developed and well-nourished.  HENT:  Head: Atraumatic.  Neck: Normal range of motion.  Cardiovascular:  Pulses equal bilaterally  Musculoskeletal: She exhibits tenderness. She exhibits no edema or deformity.       Right knee: She exhibits normal range of motion, no swelling, no effusion, no ecchymosis, no deformity, no erythema, no LCL laxity, normal meniscus and no MCL laxity. Tenderness found. Medial joint line and MCL tenderness noted.  Neurological: She is alert. She has normal strength. She displays normal reflexes. No sensory deficit.  Skin: Skin is warm and dry.  Psychiatric: She has a normal mood and affect.     ED Treatments / Results  Labs (all labs ordered are listed, but only abnormal results are displayed) Labs Reviewed - No data to display  EKG None  Radiology Dg Knee Complete 4 Views Right  Result Date: 12/28/2017 CLINICAL DATA:  Right knee pain for 6 months on the medial side. Pain has worsened over the past few days. No known injury. EXAM: RIGHT KNEE - COMPLETE 4+ VIEW COMPARISON:  None. FINDINGS: No acute bony or joint abnormality is seen. Joint spaces are preserved. Chondrocalcinosis of the menisci is noted. No joint effusion. IMPRESSION: Chondrocalcinosis.  Otherwise negative. Electronically Signed   By: Drusilla Kannerhomas  Dalessio M.D.   On: 12/28/2017 10:21    Procedures Procedures (including critical care time)  Medications Ordered in ED Medications  predniSONE (DELTASONE) tablet 60 mg (has no administration in time range)     Initial  Impression / Assessment and Plan / ED Course  I have reviewed the triage vital signs and the nursing notes.  Pertinent labs & imaging results that were available during my care of the patient were reviewed by me and considered in my medical decision making (see chart for details).     Imaging reviewed and discussed with patient.  She was placed in a knee sleeve for support.  We discussed minimizing stressing the joint by avoiding sitting in her usual position.  Heat therapy, prednisone taper prescribed.  Referral to orthopedics for further evaluation if symptoms persist or worsen. Also given referrals to obtain pcp.  Final Clinical Impressions(s) / ED Diagnoses   Final diagnoses:  Chondrocalcinosis    ED Discharge Orders         Ordered    predniSONE (DELTASONE) 10 MG tablet     12/28/17 1115           Burgess Amordol, Kiyo Heal, PA-C 12/28/17 1116    Vanetta MuldersZackowski, Scott, MD 12/29/17 1028

## 2017-12-28 NOTE — Discharge Instructions (Signed)
I recommend heat therapy as discussed 20 minutes several times daily.  Take the prednisone taper as prescribed which should improve your pain symptoms.  You may need to see a specialist as referred above for further management of your symptoms if they persist.

## 2018-05-23 ENCOUNTER — Ambulatory Visit: Attending: Internal Medicine

## 2018-05-23 ENCOUNTER — Ambulatory Visit: Admit: 2018-05-23 | Discharge: 2018-05-23 | Payer: PRIVATE HEALTH INSURANCE | Attending: Internal Medicine

## 2018-05-23 DIAGNOSIS — F329 Major depressive disorder, single episode, unspecified: Secondary | ICD-10-CM

## 2018-05-23 DIAGNOSIS — F32A Depression, unspecified: Secondary | ICD-10-CM

## 2018-05-23 MED ORDER — FLUOXETINE 20 MG CAP
20 mg | ORAL_CAPSULE | Freq: Every day | ORAL | 2 refills | Status: AC
Start: 2018-05-23 — End: ?

## 2018-05-23 NOTE — Progress Notes (Signed)
[facility] [place]    Dr. Georgian CoJeremy S. Joell Usman, D.O.  Internal Medicine  [address]   [place], [place] [id number]  Ph: [phone] Fax: [phone]    Patient: Susan Jacobson [place] Gender: female DOS: 05/23/2018     DOB: 10/02/1965  Age: 53 y.o.      Chief Complaint   Patient presents with   ??? Anxiety   ??? Depression       History of Present Illness:     Susan Jacobson is a pleasant 53 y.o. female who presents to the office today concerning history of depression/anxiety which is social situation driven as the patient has a number of family issues including a son-in-law who is addicted to heroin and she is concerned about her grandbabies in [place] and to get away from that she recently moved up to our area where she is originally from and that is why she is visiting us today.  She is eating excessively and caused her to gain weight.  She does not have any red flag symptoms with anxiety such as chest pain, palpitations or heat intolerance      History reviewed. No pertinent past medical history.  Social History     Socioeconomic History   ??? Marital status: SINGLE     Spouse name: Not on file   ??? Number of children: Not on file   ??? Years of education: Not on file   ??? Highest education level: Not on file   Occupational History   ??? Not on file   Social Needs   ??? Financial resource strain: Not on file   ??? Food insecurity:     Worry: Not on file     Inability: Not on file   ??? Transportation needs:     Medical: Not on file     Non-medical: Not on file   Tobacco Use   ??? Smoking status: Current Some Day Smoker     Types: Cigarettes   ??? Smokeless tobacco: Never Used   Substance and Sexual Activity   ??? Alcohol use: Never     Frequency: Never   ??? Drug use: Never   ??? Sexual activity: Not on file   Lifestyle   ??? Physical activity:     Days per week: Not on file     Minutes per session: Not on file   ??? Stress: Not on file   Relationships   ??? Social connections:     Talks on phone: Not on file      Gets together: Not on file     Attends religious service: Not on file     Active member of club or organization: Not on file     Attends meetings of clubs or organizations: Not on file     Relationship status: Not on file   ??? Intimate partner violence:     Fear of current or ex partner: Not on file     Emotionally abused: Not on file     Physically abused: Not on file     Forced sexual activity: Not on file   Other Topics Concern   ??? Not on file   Social History Narrative    Vision: normal    Hearing: normal    Cognitive: Cognitive/physical impairment: none    Transportation: I am completely independent and drive myself.    ADL's: Self-care  self-feeding, grooming, bathing and toileting     Ambulation Needs:  none    Living  Arrangements: With family                HEALTH LITERACY:    The patient has average health literacy with ability to obtain information and services, process the meaning of the information, understand choices available and consequences to choices, respond and act on the information as desired. The patient does appear to understand the impact of health on overall lifestyle and wellbeing.          No Known Allergies    Review of Systems   Constitutional: Negative.    HENT: Negative.    Eyes: Negative.    Respiratory: Negative.    Cardiovascular: Negative.    Gastrointestinal: Negative.    Genitourinary: Negative.    Musculoskeletal: Negative.    Skin: Negative.    Neurological: Negative.    Endo/Heme/Allergies: Negative.    Psychiatric/Behavioral: Positive for depression. The patient is nervous/anxious and has insomnia.         Patient states she has a lot of personal, family issues that are causing her to be very stressed and depressed.  States she had to move her from [place] to live here because of problems with her daughter and her husband who is on heroine.           Physical Exam:  BP (!) 124/92 (BP 1 Location: Left arm, BP Patient Position: Sitting)     Pulse 93    Temp 98.2 ??F (36.8 ??C) (Oral)    Ht 5\' 4"  (1.626 m)    Wt 210 lb (95.3 kg)    SpO2 97%    BMI 36.05 kg/m??     Physical Exam  Vitals signs and nursing note reviewed.   Constitutional:       Appearance: Normal appearance.   Cardiovascular:      Rate and Rhythm: Regular rhythm.      Heart sounds: No murmur.   Pulmonary:      Effort: No respiratory distress.      Breath sounds: Normal breath sounds.   Skin:     General: Skin is warm and dry.   Neurological:      General: No focal deficit present.      Mental Status: She is alert and oriented to person, place, and time.   Psychiatric:         Mood and Affect: Mood normal.         Behavior: Behavior normal.         Labs and imaging: Pending     Assessment and Plan:  Diagnoses and all orders for this visit:    1. Depression, unspecified depression type    2. Severe obesity (HCC)    3. Taking medication for chronic disease  -     METABOLIC PANEL, BASIC; Future    4. Routine general medical examination at a health care facility  -     LIPID PANEL; Future    5. Severe episode of recurrent major depressive disorder, without psychotic features (HCC)    Other orders  -     FLUoxetine (PROZAC) 20 mg capsule; Take 1 Cap by mouth daily.      Will check labs and start these medications.  She wants to have her depression anxiety stabilized and then will come back in a couple of months for more health maintenance  Follow Up:   Follow-up and Dispositions    ?? Return in about 2 months (around 07/22/2018), or if symptoms worsen or fail to improve.  Electronically Signed by: Georgian Co, DO on 05/23/2018

## 2018-05-23 NOTE — Progress Notes (Signed)
[facility] [place]    Dr. Georgian Co, D.O.  Internal Medicine  [address]   [place], [place] [address]  Ph: [phone] Fax: [phone]    Patient: Susan Jacobson Gender: female DOS: 05/23/2018     DOB: [birth year]  Age: 53 y.o.      Chief Complaint   Patient presents with   ??? Anxiety   ??? Depression       History of Present Illness:     Sheriden Blakemore is a pleasant 53 y.o. female who presents to the office today concerning history of depression/anxiety which is social situation driven as the patient has a number of family issues including a son-in-law who is addicted to heroin and she is concerned about her grandbabies in [place] and to get away from that she recently moved up to our area where she is originally from and that is why she is visiting [place] today.  She is eating excessively and caused her to gain weight.  She does not have any red flag symptoms with anxiety such as chest pain, palpitations or heat intolerance      History reviewed. No pertinent past medical history.  Social History     Socioeconomic History   ??? Marital status: SINGLE     Spouse name: Not on file   ??? Number of children: Not on file   ??? Years of education: Not on file   ??? Highest education level: Not on file   Occupational History   ??? Not on file   Social Needs   ??? Financial resource strain: Not on file   ??? Food insecurity:     Worry: Not on file     Inability: Not on file   ??? Transportation needs:     Medical: Not on file     Non-medical: Not on file   Tobacco Use   ??? Smoking status: Current Some Day Smoker     Types: Cigarettes   ??? Smokeless tobacco: Never Used   Substance and Sexual Activity   ??? Alcohol use: Never     Frequency: Never   ??? Drug use: Never   ??? Sexual activity: Not on file   Lifestyle   ??? Physical activity:     Days per week: Not on file     Minutes per session: Not on file   ??? Stress: Not on file   Relationships   ??? Social connections:     Talks on phone: Not on file     Gets together: Not on  file     Attends religious service: Not on file     Active member of club or organization: Not on file     Attends meetings of clubs or organizations: Not on file     Relationship status: Not on file   ??? Intimate partner violence:     Fear of current or ex partner: Not on file     Emotionally abused: Not on file     Physically abused: Not on file     Forced sexual activity: Not on file   Other Topics Concern   ??? Not on file   Social History Narrative    Vision: normal    Hearing: normal    Cognitive: Cognitive/physical impairment: none    Transportation: I am completely independent and drive myself.    ADL's: Self-care  self-feeding, grooming, bathing and toileting     Ambulation Needs:  none    Living  Arrangements: With family                HEALTH LITERACY:    The patient has average health literacy with ability to obtain information and services, process the meaning of the information, understand choices available and consequences to choices, respond and act on the information as desired. The patient does appear to understand the impact of health on overall lifestyle and wellbeing.          No Known Allergies    Review of Systems   Constitutional: Negative.    HENT: Negative.    Eyes: Negative.    Respiratory: Negative.    Cardiovascular: Negative.    Gastrointestinal: Negative.    Genitourinary: Negative.    Musculoskeletal: Negative.    Skin: Negative.    Neurological: Negative.    Endo/Heme/Allergies: Negative.    Psychiatric/Behavioral: Positive for depression. The patient is nervous/anxious and has insomnia.         Patient states she has a lot of personal, family issues that are causing her to be very stressed and depressed.  States she had to move her from [place] to live here because of problems with her daughter and her husband who is on heroine.           Physical Exam:  BP (!) 124/92 (BP 1 Location: Left arm, BP Patient Position: Sitting)    Pulse 93    Temp 98.2 ??F (36.8 ??C) (Oral)    Ht 5\' 4"   (1.626 m)    Wt 210 lb (95.3 kg)    SpO2 97%    BMI 36.05 kg/m??     Physical Exam  Vitals signs and nursing note reviewed.   Constitutional:       Appearance: Normal appearance.   Cardiovascular:      Rate and Rhythm: Regular rhythm.      Heart sounds: No murmur.   Pulmonary:      Effort: No respiratory distress.      Breath sounds: Normal breath sounds.   Skin:     General: Skin is warm and dry.   Neurological:      General: No focal deficit present.      Mental Status: She is alert and oriented to person, place, and time.   Psychiatric:         Mood and Affect: Mood normal.         Behavior: Behavior normal.         Labs and imaging: Pending     Assessment and Plan:  Diagnoses and all orders for this visit:    1. Depression, unspecified depression type    2. Severe obesity (HCC)    3. Taking medication for chronic disease  -     METABOLIC PANEL, BASIC; Future    4. Routine general medical examination at a health care facility  -     LIPID PANEL; Future    5. Severe episode of recurrent major depressive disorder, without psychotic features (HCC)    Other orders  -     FLUoxetine (PROZAC) 20 mg capsule; Take 1 Cap by mouth daily.      Will check labs and start these medications.  She wants to have her depression anxiety stabilized and then will come back in a couple of months for more health maintenance  Follow Up:   Follow-up and Dispositions    ?? Return in about 2 months (around 07/22/2018), or if symptoms worsen or fail to improve.  Electronically Signed by: Georgian Co, DO on 05/23/2018

## 2018-05-26 ENCOUNTER — Institutional Professional Consult (permissible substitution): Admit: 2018-05-26 | Discharge: 2018-05-26 | Payer: PRIVATE HEALTH INSURANCE

## 2018-05-26 ENCOUNTER — Inpatient Hospital Stay: Admit: 2018-05-26 | Payer: MEDICAID

## 2018-05-26 DIAGNOSIS — Z0189 Encounter for other specified special examinations: Secondary | ICD-10-CM

## 2018-05-26 DIAGNOSIS — Z0000 Encounter for general adult medical examination without abnormal findings: Secondary | ICD-10-CM

## 2018-05-26 LAB — LIPID PANEL
CHOL/HDL Ratio: 3.3 (ref <4.0)
Chol/HDL Ratio: 3.3 (ref <4.0)
Cholesterol, Total: 232 MG/DL — ABNORMAL HIGH (ref <200)
Cholesterol, total: 232 MG/DL — ABNORMAL HIGH (ref <200)
HDL Cholesterol: 71 MG/DL (ref 32–96)
HDL: 71 MG/DL (ref 32–96)
LDL Calculated: 134.6 MG/DL — ABNORMAL HIGH (ref <130)
LDL, calculated: 134.6 MG/DL — ABNORMAL HIGH (ref <130)
Triglyceride: 132 MG/DL (ref <150)
Triglycerides: 132 MG/DL (ref <150)
VLDL Cholesterol Calculated: 26.4 MG/DL (ref 5–32)
VLDL, calculated: 26.4 MG/DL (ref 5–32)

## 2018-05-26 LAB — BASIC METABOLIC PANEL
Anion Gap: 5 mmol/L — ABNORMAL LOW (ref 6–15)
BUN: 15 MG/DL (ref 7–18)
Bun/Cre Ratio: 16 (ref 7–25)
CO2: 27 mmol/L (ref 21–32)
Calcium: 9.1 MG/DL (ref 8.5–10.1)
Chloride: 108 mmol/L — ABNORMAL HIGH (ref 98–107)
Creatinine: 0.93 MG/DL (ref 0.60–1.30)
EGFR IF NonAfrican American: >60 mL/min/{1.73_m2} (ref >60)
GFR African American: >60 mL/min/{1.73_m2} (ref >60)
Glucose: 95 mg/dL (ref 70–110)
Potassium: 4.2 mmol/L (ref 3.5–5.3)
Sodium: 140 mmol/L (ref 136–145)

## 2018-05-26 LAB — METABOLIC PANEL, BASIC
Anion gap: 5 mmol/L — ABNORMAL LOW (ref 6–15)
BUN/Creatinine ratio: 16 (ref 7–25)
BUN: 15 MG/DL (ref 7–18)
CO2: 27 mmol/L (ref 21–32)
Calcium: 9.1 MG/DL (ref 8.5–10.1)
Chloride: 108 mmol/L — ABNORMAL HIGH (ref 98–107)
Creatinine: 0.93 MG/DL (ref 0.60–1.30)
GFR est AA: >60 mL/min/{1.73_m2} (ref >60)
GFR est non-AA: >60 mL/min/{1.73_m2} (ref >60)
Glucose: 95 mg/dL (ref 70–110)
Potassium: 4.2 mmol/L (ref 3.5–5.3)
Sodium: 140 mmol/L (ref 136–145)

## 2018-05-26 NOTE — Progress Notes (Signed)
Two patient identifiers used.  Site prepped with alcohol.     Right A/C:    Left A/C: x 1    Right hand:    Left hand:    Tubes drawn: 1 green, 1 lavender    Patient tolerated well.  Site checked for bleeding/bruising.  Secured with 2x2 and tape.

## 2018-05-26 NOTE — Progress Notes (Signed)
Please let patient know labs ok other than cholesterol is too elevated.  She likely can help with diet since she was eating a lot due to stress.  Needs to target 100grams of carbs a day- will recheck in 6 months.    Thanks,  JSK

## 2018-05-26 NOTE — Progress Notes (Signed)
Two patient identifiers used.  Site prepped with alcohol.     Right A/C:    Left A/C:x 1    Right hand:    Left hand:    Tubes drawn: 1 green, 1 lavender    Patient tolerated well.  Site checked for bleeding/bruising.  Secured with 2x2 and tape.

## 2018-05-26 NOTE — Progress Notes (Signed)
Patient notified and voiced understanding.

## 2018-05-27 NOTE — Progress Notes (Signed)
Please let patient know labs ok other than cholesterol is too elevated.  She likely can help with diet since she was eating a lot due to stress.  Needs to target 100grams of carbs a day- will recheck in 6 months.    Thanks,  JSK

## 2018-05-27 NOTE — Progress Notes (Signed)
Patient notified and voiced understanding.

## 2018-06-20 ENCOUNTER — Ambulatory Visit: Attending: Internal Medicine

## 2018-06-20 ENCOUNTER — Ambulatory Visit: Admit: 2018-06-20 | Discharge: 2018-06-20 | Payer: PRIVATE HEALTH INSURANCE | Attending: Internal Medicine

## 2018-06-20 DIAGNOSIS — K0889 Other specified disorders of teeth and supporting structures: Secondary | ICD-10-CM

## 2018-06-20 MED ORDER — AMOXICILLIN CLAVULANATE 875 MG-125 MG TAB
875-125 mg | ORAL_TABLET | Freq: Two times a day (BID) | ORAL | 0 refills | Status: AC
Start: 2018-06-20 — End: 2018-06-27

## 2018-06-20 NOTE — Progress Notes (Addendum)
[facility]    Dr. Georgian Co, D.O.  Internal Medicine  [address]   [place], [place] [address]  Ph: [phone] Fax: [phone]    Patient: Susan Jacobson Gender: female DOS: 06/20/2018     DOB: 04-Aug-1965  Age: 53 y.o.      Chief Complaint   Patient presents with   ??? Dental Pain       History of Present Illness:     Susan Jacobson is a pleasant 53 y.o. female who presents to the office today concerning left bottom molar tooth pain second from the back which is been going on for a couple weeks.  She has a dentist appointment on Tuesday and was inquiring about antibiotic therapy for her tooth pain.  No fever.  She also reports that the Prozac we gave her has helped her tremendously.Marland Kitchen      History reviewed. No pertinent past medical history.  Social History     Socioeconomic History   ??? Marital status: SINGLE     Spouse name: Not on file   ??? Number of children: Not on file   ??? Years of education: Not on file   ??? Highest education level: Not on file   Occupational History   ??? Not on file   Social Needs   ??? Financial resource strain: Not on file   ??? Food insecurity:     Worry: Not on file     Inability: Not on file   ??? Transportation needs:     Medical: Not on file     Non-medical: Not on file   Tobacco Use   ??? Smoking status: Current Some Day Smoker     Types: Cigarettes   ??? Smokeless tobacco: Never Used   Substance and Sexual Activity   ??? Alcohol use: Never     Frequency: Never   ??? Drug use: Never   ??? Sexual activity: Not on file   Lifestyle   ??? Physical activity:     Days per week: Not on file     Minutes per session: Not on file   ??? Stress: Not on file   Relationships   ??? Social connections:     Talks on phone: Not on file     Gets together: Not on file     Attends religious service: Not on file     Active member of club or organization: Not on file     Attends meetings of clubs or organizations: Not on file     Relationship status: Not on file   ??? Intimate partner violence:      Fear of current or ex partner: Not on file     Emotionally abused: Not on file     Physically abused: Not on file     Forced sexual activity: Not on file   Other Topics Concern   ??? Not on file   Social History Narrative    Vision: normal    Hearing: normal    Cognitive: Cognitive/physical impairment: none    Transportation: I am completely independent and drive myself.    ADL's: Self-care  self-feeding, grooming, bathing and toileting     Ambulation Needs:  none    Living Arrangements: With family                HEALTH LITERACY:    The patient has average health literacy with ability to obtain information and services, process the meaning of the information,  understand choices available and consequences to choices, respond and act on the information as desired. The patient does appear to understand the impact of health on overall lifestyle and wellbeing.        Current Outpatient Medications   Medication Sig Dispense Refill   ??? amoxicillin-clavulanate (AUGMENTIN) 875-125 mg per tablet Take 1 Tab by mouth two (2) times a day for 7 days. 14 Tab 0   ??? FLUoxetine (PROZAC) 20 mg capsule Take 1 Cap by mouth daily. 30 Cap 2     No Known Allergies    Review of Systems   Constitutional: Negative.    HENT:        Tooth pain.  Patient feels she has an abscessed tooth and is unable to get in to her dentist until at least next Tuesday and would like to get an antibiotic to start until then.     Eyes: Negative.    Respiratory: Negative.    Cardiovascular: Negative.    Gastrointestinal: Negative.    Genitourinary: Negative.    Musculoskeletal: Negative.    Skin: Negative.    Neurological: Negative.    Endo/Heme/Allergies: Negative.    Psychiatric/Behavioral: Negative.          Physical Exam:  BP 120/78 (BP 1 Location: Left arm, BP Patient Position: Sitting)    Pulse 80    Temp 97.7 ??F (36.5 ??C) (Oral)    Ht 5\' 4"  (1.626 m)    Wt 217 lb (98.4 kg)    SpO2 96%    BMI 37.25 kg/m??     Physical Exam   Vitals signs and nursing note reviewed.   Constitutional:       Appearance: Normal appearance.   HENT:      Mouth/Throat:      Comments: Left bottom molar second from the very back with mild erythema at the base.  Cardiovascular:      Rate and Rhythm: Regular rhythm.      Heart sounds: No murmur.   Pulmonary:      Effort: No respiratory distress.      Breath sounds: Normal breath sounds.   Skin:     General: Skin is warm and dry.   Neurological:      General: No focal deficit present.      Mental Status: She is alert and oriented to person, place, and time.   Psychiatric:         Mood and Affect: Mood normal.         Behavior: Behavior normal.         Labs and imaging: not obtained     Assessment and Plan:  Diagnoses and all orders for this visit:    1. Tooth pain    Other orders  -     amoxicillin-clavulanate (AUGMENTIN) 875-125 mg per tablet; Take 1 Tab by mouth two (2) times a day for 7 days.      Will follow up with dentist on Tuesday.  Return if not better or worsening symptoms.       Follow Up:   Follow-up and Dispositions    ?? Return if symptoms worsen or fail to improve.           Electronically Signed by: Georgian Co, DO on 06/20/2018

## 2018-06-20 NOTE — Progress Notes (Signed)
[facility]    Dr. Georgian Co, D.O.  Internal Medicine  [address]   [place], [place] [address]  Ph: [phone] Fax: [phone]    Patient: Susan Jacobson Gender: female DOS: 06/20/2018     DOB: 07/11/1965  Age: 53 y.o.      Chief Complaint   Patient presents with   ??? Dental Pain       History of Present Illness:     Kourtnei Kreyling is a pleasant 53 y.o. female who presents to the office today concerning left bottom molar tooth pain second from the back which is been going on for a couple weeks.  She has a dentist appointment on Tuesday and was inquiring about antibiotic therapy for her tooth pain.  No fever.  She also reports that the Prozac we gave her has helped her tremendously.Marland Kitchen      History reviewed. No pertinent past medical history.  Social History     Socioeconomic History   ??? Marital status: SINGLE     Spouse name: Not on file   ??? Number of children: Not on file   ??? Years of education: Not on file   ??? Highest education level: Not on file   Occupational History   ??? Not on file   Social Needs   ??? Financial resource strain: Not on file   ??? Food insecurity:     Worry: Not on file     Inability: Not on file   ??? Transportation needs:     Medical: Not on file     Non-medical: Not on file   Tobacco Use   ??? Smoking status: Current Some Day Smoker     Types: Cigarettes   ??? Smokeless tobacco: Never Used   Substance and Sexual Activity   ??? Alcohol use: Never     Frequency: Never   ??? Drug use: Never   ??? Sexual activity: Not on file   Lifestyle   ??? Physical activity:     Days per week: Not on file     Minutes per session: Not on file   ??? Stress: Not on file   Relationships   ??? Social connections:     Talks on phone: Not on file     Gets together: Not on file     Attends religious service: Not on file     Active member of club or organization: Not on file     Attends meetings of clubs or organizations: Not on file     Relationship status: Not on file   ??? Intimate partner violence:     Fear of  current or ex partner: Not on file     Emotionally abused: Not on file     Physically abused: Not on file     Forced sexual activity: Not on file   Other Topics Concern   ??? Not on file   Social History Narrative    Vision: normal    Hearing: normal    Cognitive: Cognitive/physical impairment: none    Transportation: I am completely independent and drive myself.    ADL's: Self-care  self-feeding, grooming, bathing and toileting     Ambulation Needs:  none    Living Arrangements: With family                HEALTH LITERACY:    The patient has average health literacy with ability to obtain information and services, process the meaning of the information,  understand choices available and consequences to choices, respond and act on the information as desired. The patient does appear to understand the impact of health on overall lifestyle and wellbeing.        Current Outpatient Medications   Medication Sig Dispense Refill   ??? amoxicillin-clavulanate (AUGMENTIN) 875-125 mg per tablet Take 1 Tab by mouth two (2) times a day for 7 days. 14 Tab 0   ??? FLUoxetine (PROZAC) 20 mg capsule Take 1 Cap by mouth daily. 30 Cap 2     No Known Allergies    Review of Systems   Constitutional: Negative.    HENT:        Tooth pain.  Patient feels she has an abscessed tooth and is unable to get in to her dentist until at least next Tuesday and would like to get an antibiotic to start until then.     Eyes: Negative.    Respiratory: Negative.    Cardiovascular: Negative.    Gastrointestinal: Negative.    Genitourinary: Negative.    Musculoskeletal: Negative.    Skin: Negative.    Neurological: Negative.    Endo/Heme/Allergies: Negative.    Psychiatric/Behavioral: Negative.          Physical Exam:  BP 120/78 (BP 1 Location: Left arm, BP Patient Position: Sitting)    Pulse 80    Temp 97.7 ??F (36.5 ??C) (Oral)    Ht 5\' 4"  (1.626 m)    Wt 217 lb (98.4 kg)    SpO2 96%    BMI 37.25 kg/m??     Physical Exam  Vitals signs and nursing note reviewed.    Constitutional:       Appearance: Normal appearance.   HENT:      Mouth/Throat:      Comments: Left bottom molar second from the very back with mild erythema at the base.  Cardiovascular:      Rate and Rhythm: Regular rhythm.      Heart sounds: No murmur.   Pulmonary:      Effort: No respiratory distress.      Breath sounds: Normal breath sounds.   Skin:     General: Skin is warm and dry.   Neurological:      General: No focal deficit present.      Mental Status: She is alert and oriented to person, place, and time.   Psychiatric:         Mood and Affect: Mood normal.         Behavior: Behavior normal.         Labs and imaging: not obtained     Assessment and Plan:  Diagnoses and all orders for this visit:    1. Tooth pain    Other orders  -     amoxicillin-clavulanate (AUGMENTIN) 875-125 mg per tablet; Take 1 Tab by mouth two (2) times a day for 7 days.      Will follow up with dentist on Tuesday.  Return if not better or worsening symptoms.       Follow Up:   Follow-up and Dispositions    ?? Return if symptoms worsen or fail to improve.           Electronically Signed by: Georgian Co, DO on 06/20/2018
